# Patient Record
Sex: Female | Born: 1970
Health system: Southern US, Community
[De-identification: ages and names within clinical notes are randomized; demographics above are authoritative.]

## PROBLEM LIST (undated history)

## (undated) DIAGNOSIS — E785 Hyperlipidemia, unspecified: Secondary | ICD-10-CM

## (undated) HISTORY — DX: Hyperlipidemia, unspecified: E78.5

---

## 2007-06-25 ENCOUNTER — Emergency Department (HOSPITAL_COMMUNITY): Admission: EM | Admit: 2007-06-25 | Discharge: 2007-06-25 | Payer: Self-pay | Admitting: Family Medicine

## 2008-03-31 ENCOUNTER — Emergency Department (HOSPITAL_COMMUNITY): Admission: EM | Admit: 2008-03-31 | Discharge: 2008-03-31 | Payer: Self-pay | Admitting: Emergency Medicine

## 2016-06-08 DIAGNOSIS — K649 Unspecified hemorrhoids: Secondary | ICD-10-CM | POA: Insufficient documentation

## 2016-07-15 ENCOUNTER — Ambulatory Visit: Payer: Self-pay

## 2016-07-15 ENCOUNTER — Other Ambulatory Visit: Payer: Self-pay | Admitting: Occupational Medicine

## 2016-07-15 DIAGNOSIS — M25512 Pain in left shoulder: Secondary | ICD-10-CM

## 2016-09-04 DIAGNOSIS — J3489 Other specified disorders of nose and nasal sinuses: Secondary | ICD-10-CM | POA: Diagnosis not present

## 2016-09-04 DIAGNOSIS — R509 Fever, unspecified: Secondary | ICD-10-CM | POA: Diagnosis not present

## 2016-09-04 DIAGNOSIS — R52 Pain, unspecified: Secondary | ICD-10-CM | POA: Diagnosis not present

## 2016-09-04 DIAGNOSIS — R6889 Other general symptoms and signs: Secondary | ICD-10-CM | POA: Diagnosis not present

## 2016-09-04 DIAGNOSIS — J101 Influenza due to other identified influenza virus with other respiratory manifestations: Secondary | ICD-10-CM | POA: Diagnosis not present

## 2016-11-10 DIAGNOSIS — H5213 Myopia, bilateral: Secondary | ICD-10-CM | POA: Diagnosis not present

## 2016-12-25 ENCOUNTER — Ambulatory Visit (HOSPITAL_COMMUNITY)
Admission: EM | Admit: 2016-12-25 | Discharge: 2016-12-25 | Disposition: A | Discharge status: Home / Self Care | Payer: 59 | Attending: Emergency Medicine | Admitting: Emergency Medicine

## 2016-12-25 ENCOUNTER — Encounter (HOSPITAL_COMMUNITY): Payer: Self-pay | Admitting: Family Medicine

## 2016-12-25 DIAGNOSIS — J111 Influenza due to unidentified influenza virus with other respiratory manifestations: Secondary | ICD-10-CM

## 2016-12-25 DIAGNOSIS — J0100 Acute maxillary sinusitis, unspecified: Secondary | ICD-10-CM | POA: Diagnosis not present

## 2016-12-25 DIAGNOSIS — R69 Illness, unspecified: Secondary | ICD-10-CM

## 2016-12-25 MED ORDER — PREDNISONE 20 MG PO TABS: 40.0000 mg | ORAL_TABLET | Freq: Every day | ORAL | 0 refills | Status: AC

## 2016-12-25 MED ORDER — AMOXICILLIN-POT CLAVULANATE 875-125 MG PO TABS: 1.0000 | ORAL_TABLET | Freq: Two times a day (BID) | ORAL | 0 refills | Status: DC

## 2016-12-25 MED ORDER — OSELTAMIVIR PHOSPHATE 75 MG PO CAPS: 75.0000 mg | ORAL_CAPSULE | Freq: Two times a day (BID) | ORAL | 0 refills | Status: DC

## 2016-12-25 MED ORDER — IBUPROFEN 800 MG PO TABS: 800.0000 mg | ORAL_TABLET | Freq: Three times a day (TID) | ORAL | 0 refills | Status: DC

## 2016-12-25 MED ORDER — FLUTICASONE PROPIONATE 50 MCG/ACT NA SUSP: 2.0000 | Freq: Every day | NASAL | 0 refills | Status: DC

## 2016-12-25 MED ORDER — HYDROCOD POLST-CPM POLST ER 10-8 MG/5ML PO SUER: 5.0000 mL | Freq: Two times a day (BID) | ORAL | 0 refills | Status: DC | PRN

## 2016-12-25 MED FILL — OSELTAMIVIR PHOS 75 MG CAP: 75 | 5 days supply | Qty: 10 | Fill #0

## 2016-12-25 MED FILL — HYDROCODONE-CHLORPHENIRAM S: 10-8 | 12 days supply | Qty: 120 | Fill #0

## 2016-12-25 MED FILL — predniSONE 20 MG TABS: 20 | 5 days supply | Qty: 10 | Fill #0

## 2016-12-25 NOTE — Discharge Instructions (Signed)
Take the medication as written. Return if you get worse, have a fever >100.4, or for any concerns. You may take 800 mg of motrin with 1 gram of tylenol up to 3 times a day as needed for pain. This is an effective combination for pain.  Most sinus infections are viral and do not need antibiotics unless you have a high fever, have had this for 10 days, or you get better and then get sick again. Use a neti pot or the NeilMed sinus rinse as often as you want to to reduce nasal congestion. Follow the directions on the box.   Tamiflu, Flonase, saline nasal irrigation, Mucinex D 1200/120, ibuprofen with 1 g of Tylenol 3 times a day, Tussionex. wait-and-see prescription of Augmentin and prednisone if this develops into a bacterial sinus infection, if you're not getting better despite the Tamiflu, Mucinex, saline nasal irrigation and the Flonase, for facial swelling.  Go to www.goodrx.com to look up your medications. This will give you a list of where you can find your prescriptions at the most affordable prices.

## 2016-12-25 NOTE — ED Provider Notes (Signed)
HPI  SUBJECTIVE:  Claudia Duran is a 46 y.o. female who presents with influenza-like symptoms with greenish yellowish nasal congestion, rhinorrhea, postnasal drip, body aches, headaches, cough productive of the same material as her postnasal drip, wheezing, fatigue, sinus pain and pressure. This started 2 days ago. She is unsure if she has had any fevers, but she's been alternating Tylenol 1 g and ibuprofen 800 mg which helped her symptoms. She has also tried Zicam and DayQuil. Symptoms worse with bending forward and lying down. She states that her upper teeth hurt. Denies facial swelling. She denies shortness of breath, chest pain. She states that she cannot sleep at night secondary to the cough. 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 She did get a flu shot this year. She denies chest pain, neck stiffness, rash, photophobia, abdominal pain, urinary complaints. She states that she has gotten the flu "every year" and is usually accompanied with a sinusitis with facial swelling. She states that she is usually prescribed Tamiflu, steroids and antibiotics. She has no past medical history of asthma, emphysema, COPD, diabetes, hypertension. She is not a smoker. LMP: 12/25. She denies the possibility of being pregnant. PMD: [facility]    History reviewed. No pertinent past medical history.  History reviewed. No pertinent surgical history.  History reviewed. No pertinent family history.  Social History  Substance Use Topics  . Smoking status: Never Smoker  . Smokeless tobacco: Never Used  . Alcohol use Not on file    No current facility-administered medications for this encounter.   Current Outpatient Prescriptions:  .  amoxicillin-clavulanate (AUGMENTIN) 875-125 MG tablet, Take 1 tablet by mouth 2 (two) times daily. X 7 days, Disp: 14 tablet, Rfl: 0 .  chlorpheniramine-HYDROcodone (TUSSIONEX PENNKINETIC ER) 10-8 MG/5ML SUER, Take 5 mLs by mouth every 12 (twelve) hours  as needed for cough., Disp: 120 mL, Rfl: 0 .  fluticasone (FLONASE) 50 MCG/ACT nasal spray, Place 2 sprays into both nostrils daily., Disp: 16 g, Rfl: 0 .  ibuprofen (ADVIL,MOTRIN) 800 MG tablet, Take 1 tablet (800 mg total) by mouth 3 (three) times daily., Disp: 30 tablet, Rfl: 0 .  oseltamivir (TAMIFLU) 75 MG capsule, Take 1 capsule (75 mg total) by mouth 2 (two) times daily. X 5 days, Disp: 10 capsule, Rfl: 0 .  predniSONE (DELTASONE) 20 MG tablet, Take 2 tablets (40 mg total) by mouth daily with breakfast., Disp: 10 tablet, Rfl: 0  Allergies  Allergen Reactions  . Aspirin      ROS  As noted in HPI.   Physical Exam  BP 135/98   Pulse 98   Temp 98.3 F (36.8 C)   Resp 18   LMP 12/14/2016   SpO2 98%   Constitutional: Well developed, well nourished, no acute distress Eyes:  EOMI, conjunctiva normal bilaterally HENT: Normocephalic, atraumatic, mucus membranes moist. TM's as normal bilaterally. Positive clear rhinorrhea. Negative frontal sinus tenderness. Positive maxillary sinus tenderness. Normal oropharynx. Positive postnasal drip. Neck: Positive shotty cervical lymphadenopathy. No meningismus Respiratory: Normal inspiratory effort, lungs clear bilaterally good air movement Cardiovascular: Normal rate regular rhythm, no murmurs rubs or gallops. GI: nondistended skin: No rash, skin intact Musculoskeletal: no deformities Neurologic: Alert & oriented x 3, no focal neuro deficits Psychiatric: Speech and behavior appropriate   ED Course   Medications - No data to display  No orders of the defined types were placed in this encounter.   No results found for this or any previous visit (from  the past 24 hour(s)). No results found.  ED Clinical Impression  Influenza-like illness  Acute maxillary sinusitis, recurrence not specified   ED Assessment/Plan  Vitals normal, but patient did take antipyretics within 6-8 hours of evaluation. Presentation most consistent with  influenza-like illness and viral sinusitis. will prescribe Tamiflu, Flonase, saline nasal irrigation, Mucinex D 1200/120, ibuprofen with 1 g of Tylenol 3 times a day, Tussionex. We'll send home with a wait-and-see prescription of Augmentin and prednisone if this develops into a bacterial sinus infection.  discussed medical decision-making, plan for follow up, signs and symptoms that should prompt return to the ED. Patient agrees with plan.   Meds ordered this encounter  Medications  . chlorpheniramine-HYDROcodone (TUSSIONEX PENNKINETIC ER) 10-8 MG/5ML SUER    Sig: Take 5 mLs by mouth every 12 (twelve) hours as needed for cough.    Dispense:  120 mL    Refill:  0  . fluticasone (FLONASE) 50 MCG/ACT nasal spray    Sig: Place 2 sprays into both nostrils daily.    Dispense:  16 g    Refill:  0  . oseltamivir (TAMIFLU) 75 MG capsule    Sig: Take 1 capsule (75 mg total) by mouth 2 (two) times daily. X 5 days    Dispense:  10 capsule    Refill:  0  . ibuprofen (ADVIL,MOTRIN) 800 MG tablet    Sig: Take 1 tablet (800 mg total) by mouth 3 (three) times daily.    Dispense:  30 tablet    Refill:  0  . amoxicillin-clavulanate (AUGMENTIN) 875-125 MG tablet    Sig: Take 1 tablet by mouth 2 (two) times daily. X 7 days    Dispense:  14 tablet    Refill:  0  . predniSONE (DELTASONE) 20 MG tablet    Sig: Take 2 tablets (40 mg total) by mouth daily with breakfast.    Dispense:  10 tablet    Refill:  0    *This clinic note was created using Scientist, clinical (histocompatibility and immunogenetics). Therefore, there may be occasional mistakes despite careful proofreading.  ?   Domenick Gong, MD 12/25/16 [phone]

## 2016-12-25 NOTE — ED Triage Notes (Signed)
Pt here for 2 day of URI symptoms.

## 2017-01-05 MED FILL — ANUCORT-HC 25 MG SUPP: 25 | 15 days supply | Qty: 30 | Fill #0

## 2017-06-01 MED FILL — AMOX-CLAV 875-125 MG TABLET: 875-125 | 7 days supply | Qty: 14 | Fill #0

## 2017-06-01 MED FILL — FLUTICASONE PROP 50 MCG SPR: 50 | 30 days supply | Qty: 16 | Fill #0

## 2017-06-14 MED FILL — ANUCORT-HC 25 MG SUPP: 25 | 15 days supply | Qty: 30 | Fill #0

## 2017-10-07 DIAGNOSIS — J069 Acute upper respiratory infection, unspecified: Secondary | ICD-10-CM | POA: Diagnosis not present

## 2017-10-07 DIAGNOSIS — J101 Influenza due to other identified influenza virus with other respiratory manifestations: Secondary | ICD-10-CM | POA: Diagnosis not present

## 2017-10-07 MED FILL — HYDROCODONE-CHLORPHEN ER SU: 10-8 | 18 days supply | Qty: 90 | Fill #0

## 2017-10-07 MED FILL — AMANTADINE HCL 100 MG TAB: 100 | 5 days supply | Qty: 10 | Fill #0

## 2018-01-10 MED FILL — HEMMOREX-HC 25 MG SUPP: 25 | 15 days supply | Qty: 30 | Fill #0

## 2018-01-17 DIAGNOSIS — B349 Viral infection, unspecified: Secondary | ICD-10-CM | POA: Diagnosis not present

## 2018-01-17 MED FILL — AMANTADINE HCL 100 MG TAB: 100 | 5 days supply | Qty: 10 | Fill #0

## 2018-01-17 MED FILL — HYDROCODONE-CHLORPHEN ER SU: 10-8 | 18 days supply | Qty: 90 | Fill #0

## 2018-01-30 IMAGING — CR DG SHOULDER 2+V*L*
4 series · 4 of 4 positions shown · non-contrast
Comparison: None.

CLINICAL DATA: Injured the left shoulder 1 week ago with persistent
pain

EXAM:
LEFT SHOULDER - 2+ VIEW

[view not recorded (1 of 4)]
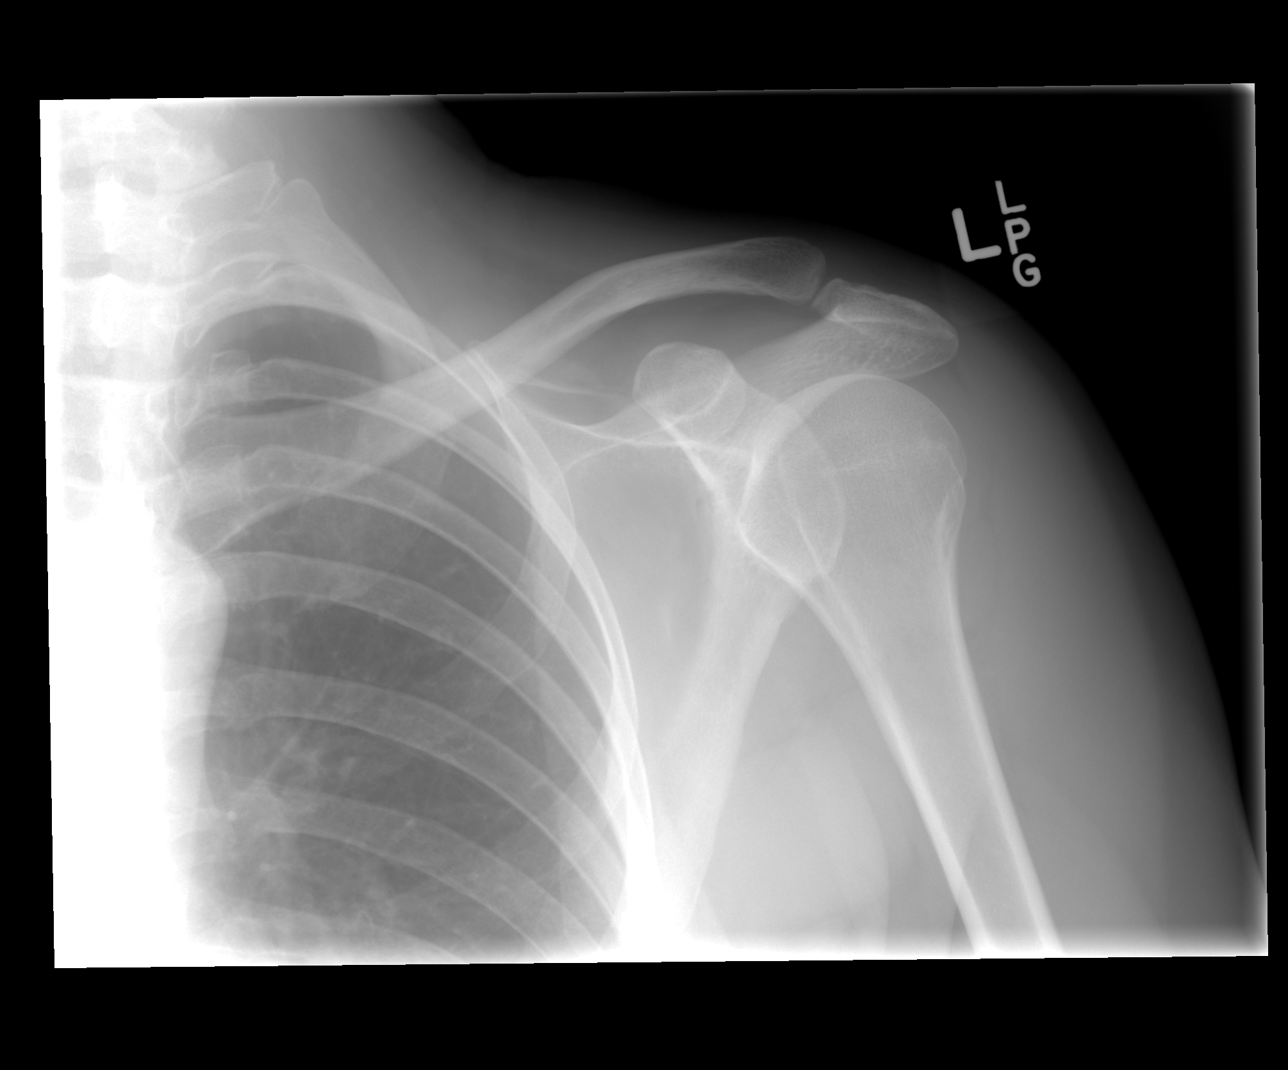

[view not recorded (2 of 4)]
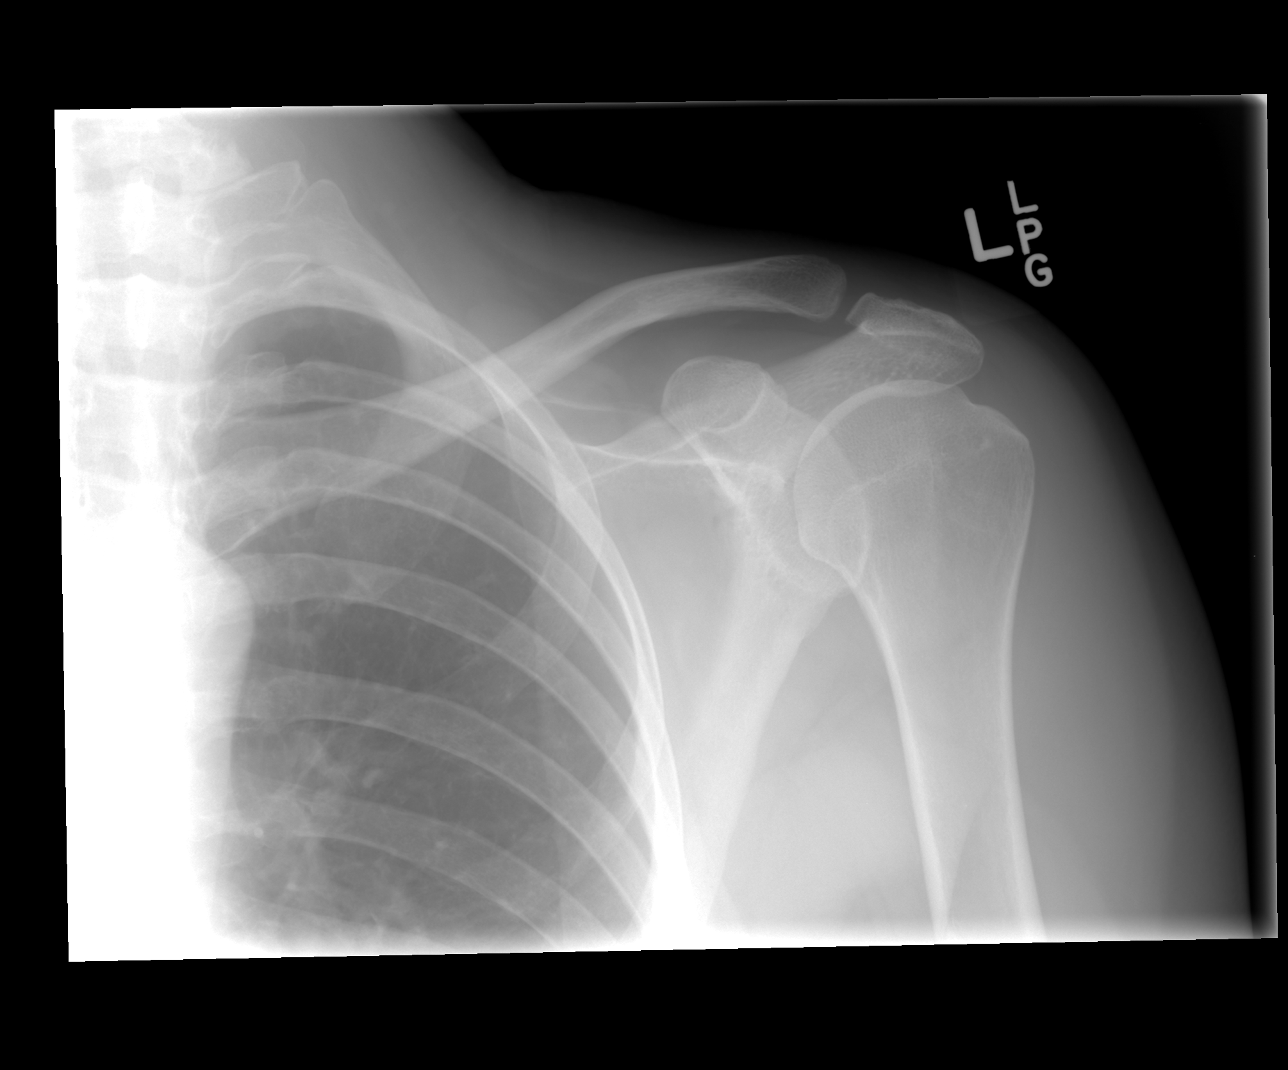

[view not recorded (3 of 4)]
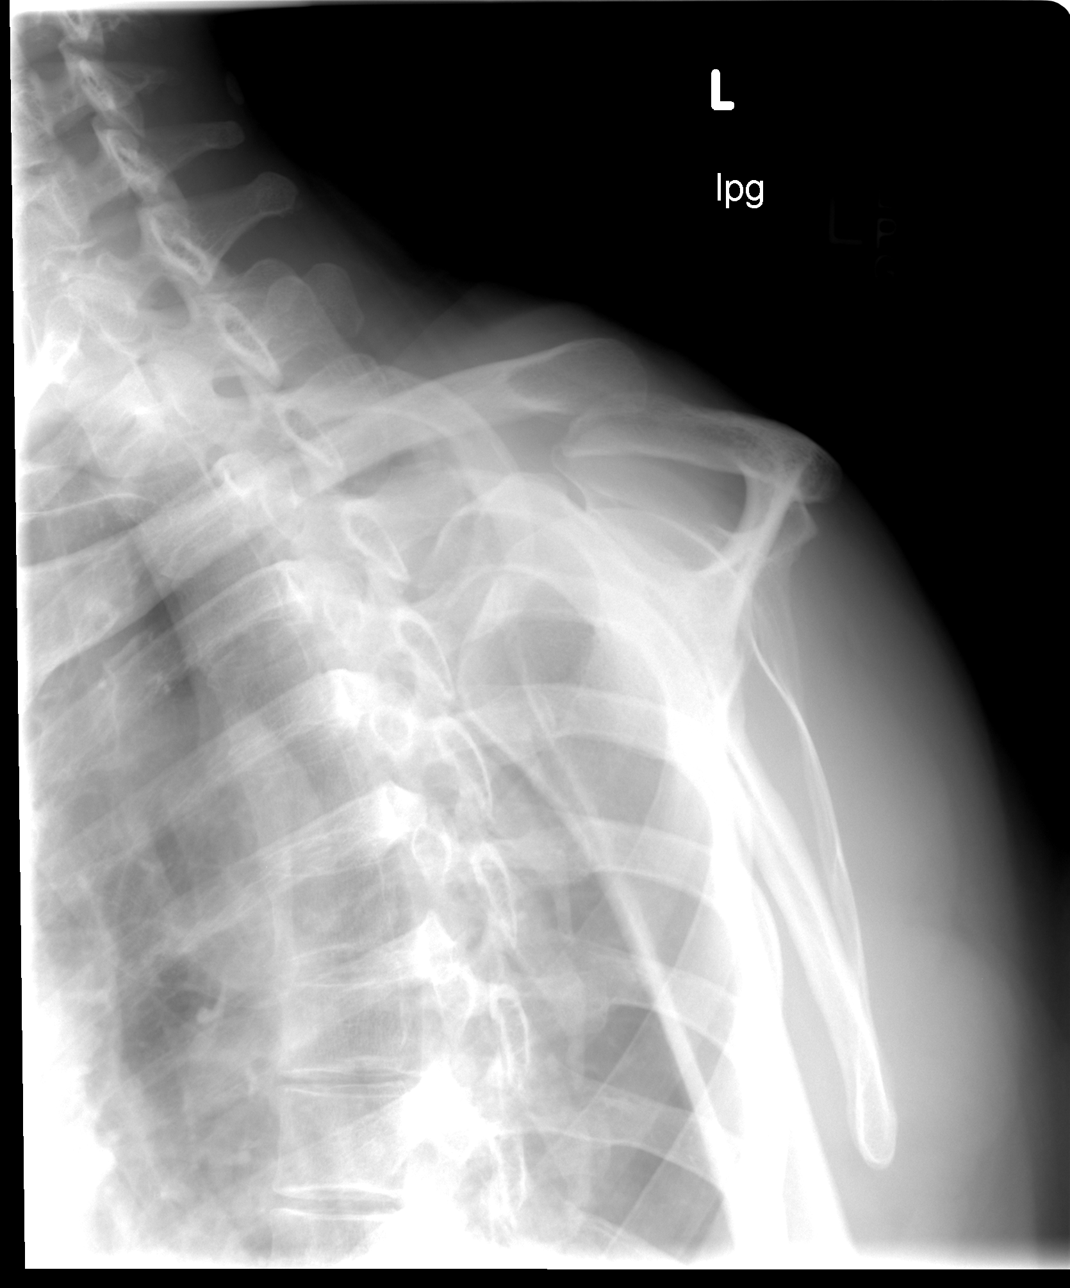

[view not recorded (4 of 4)]
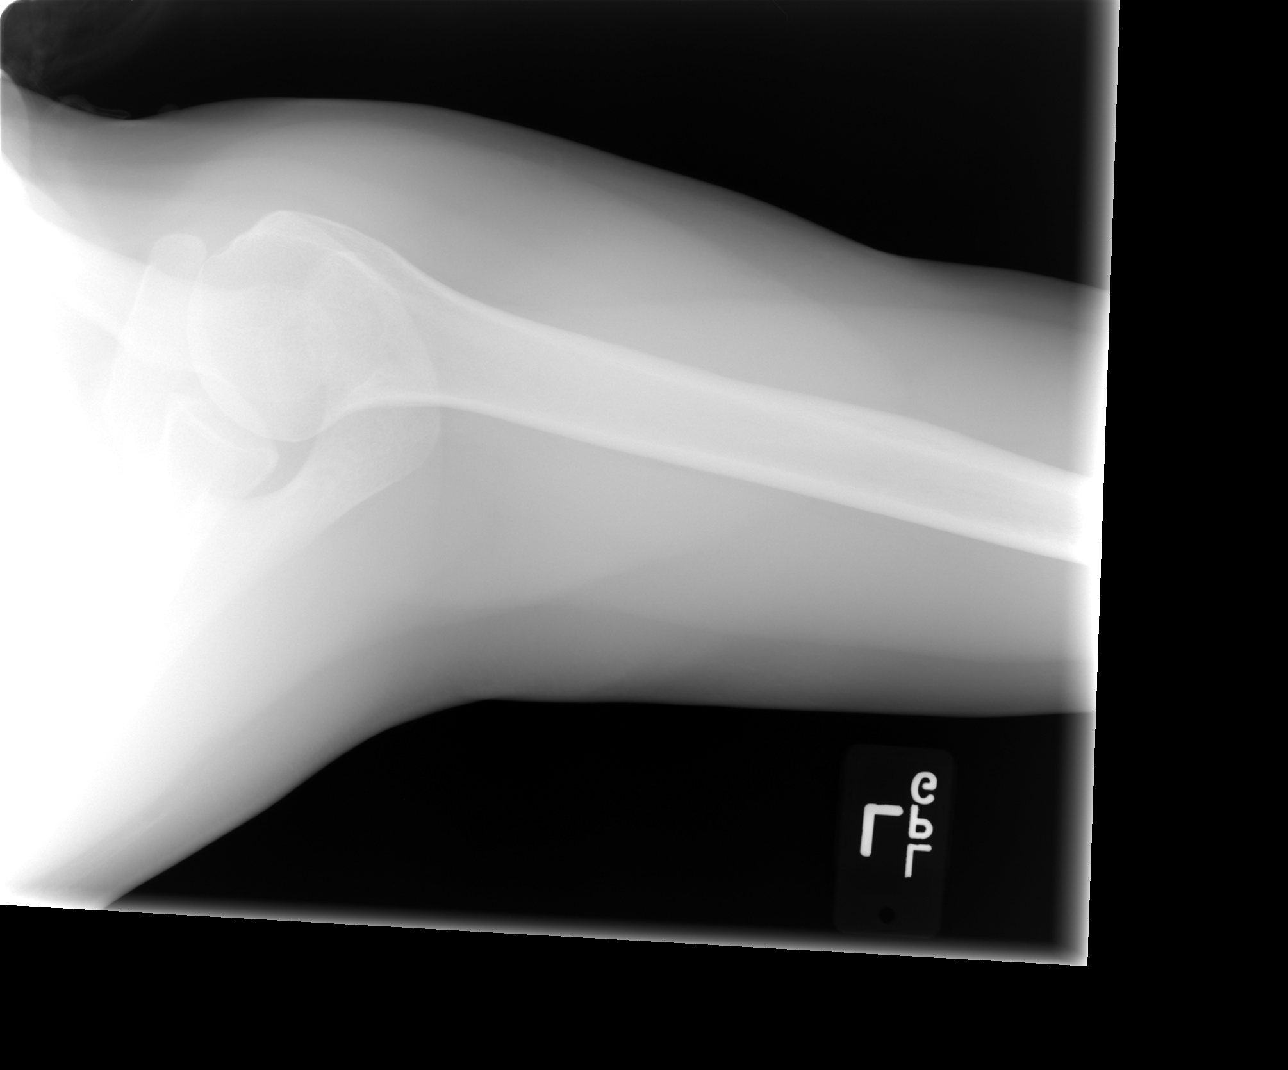

[4 of 4 positions shown; findings below may reference images not displayed]

FINDINGS: The left humeral head is in normal position and the left
glenohumeral joint space appears normal. Left AC joint is normally
aligned. No acute abnormality is seen.
IMPRESSION: Negative.

## 2018-05-13 MED FILL — HYDROCORTISONE ACETATE 25 M: 25 | 15 days supply | Qty: 30 | Fill #0

## 2018-06-20 DIAGNOSIS — Z0000 Encounter for general adult medical examination without abnormal findings: Secondary | ICD-10-CM | POA: Diagnosis not present

## 2018-06-20 DIAGNOSIS — Z13220 Encounter for screening for lipoid disorders: Secondary | ICD-10-CM | POA: Diagnosis not present

## 2018-06-20 DIAGNOSIS — Z130 Encounter for screening for diseases of the blood and blood-forming organs and certain disorders involving the immune mechanism: Secondary | ICD-10-CM | POA: Diagnosis not present

## 2018-06-20 DIAGNOSIS — N6320 Unspecified lump in the left breast, unspecified quadrant: Secondary | ICD-10-CM | POA: Diagnosis not present

## 2018-06-20 DIAGNOSIS — N941 Unspecified dyspareunia: Secondary | ICD-10-CM | POA: Diagnosis not present

## 2018-06-20 DIAGNOSIS — Z309 Encounter for contraceptive management, unspecified: Secondary | ICD-10-CM | POA: Diagnosis not present

## 2018-06-20 DIAGNOSIS — N6341 Unspecified lump in right breast, subareolar: Secondary | ICD-10-CM | POA: Diagnosis not present

## 2018-06-20 DIAGNOSIS — N920 Excessive and frequent menstruation with regular cycle: Secondary | ICD-10-CM | POA: Diagnosis not present

## 2018-06-20 MED FILL — NUVARING VAGINAL RING: 0.12-0.015 | 28 days supply | Qty: 1 | Fill #0

## 2018-06-24 DIAGNOSIS — N6002 Solitary cyst of left breast: Secondary | ICD-10-CM | POA: Diagnosis not present

## 2018-06-24 DIAGNOSIS — N6001 Solitary cyst of right breast: Secondary | ICD-10-CM | POA: Diagnosis not present

## 2018-06-24 DIAGNOSIS — R922 Inconclusive mammogram: Secondary | ICD-10-CM | POA: Diagnosis not present

## 2018-06-30 ENCOUNTER — Ambulatory Visit: Payer: Self-pay | Admitting: Obstetrics & Gynecology

## 2018-07-12 MED FILL — NUVARING VAGINAL RING: 0.12-0.015 | 28 days supply | Qty: 1 | Fill #1

## 2018-08-25 MED FILL — NUVARING VAGINAL RING: 0.12-0.015 | 28 days supply | Qty: 1 | Fill #2

## 2018-09-28 MED FILL — NUVARING VAGINAL RING: 0.12-0.015 | 28 days supply | Qty: 1 | Fill #0

## 2018-10-18 ENCOUNTER — Ambulatory Visit (INDEPENDENT_AMBULATORY_CARE_PROVIDER_SITE_OTHER): Payer: Self-pay | Admitting: Nurse Practitioner

## 2018-10-18 DIAGNOSIS — Z23 Encounter for immunization: Secondary | ICD-10-CM

## 2018-10-18 NOTE — Progress Notes (Signed)
Pt presents here today for visit to receive Influenza(right deltoid) vaccine. Allergies reviewed, vaccine given, vaccine information statement provided, tolerated well.     

## 2018-11-18 MED FILL — NUVARING VAGINAL RING: 0.12-0.015 | 28 days supply | Qty: 1 | Fill #1

## 2018-12-01 ENCOUNTER — Ambulatory Visit (INDEPENDENT_AMBULATORY_CARE_PROVIDER_SITE_OTHER): Payer: Self-pay | Admitting: Family Medicine

## 2018-12-01 VITALS — BP 125/90 | HR 115 | Temp 98.8°F | Resp 18 | Wt 153.2 lb

## 2018-12-01 DIAGNOSIS — R6889 Other general symptoms and signs: Secondary | ICD-10-CM

## 2018-12-01 DIAGNOSIS — J101 Influenza due to other identified influenza virus with other respiratory manifestations: Secondary | ICD-10-CM

## 2018-12-01 DIAGNOSIS — R05 Cough: Secondary | ICD-10-CM

## 2018-12-01 DIAGNOSIS — R059 Cough, unspecified: Secondary | ICD-10-CM

## 2018-12-01 LAB — POCT INFLUENZA A/B
INFLUENZA A, POC: POSITIVE — AB
INFLUENZA B, POC: NEGATIVE

## 2018-12-01 MED ORDER — OSELTAMIVIR PHOSPHATE 75 MG PO CAPS: 75.0000 mg | ORAL_CAPSULE | Freq: Two times a day (BID) | ORAL | 0 refills | Status: DC

## 2018-12-01 MED ORDER — AMANTADINE HCL 100 MG PO CAPS: 100.0000 mg | ORAL_CAPSULE | Freq: Two times a day (BID) | ORAL | 0 refills | Status: DC

## 2018-12-01 MED ORDER — PROMETHAZINE-DM 6.25-15 MG/5ML PO SYRP: 5.0000 mL | ORAL_SOLUTION | Freq: Three times a day (TID) | ORAL | 0 refills | Status: DC | PRN

## 2018-12-01 MED ORDER — BENZONATATE 200 MG PO CAPS: 200.0000 mg | ORAL_CAPSULE | Freq: Three times a day (TID) | ORAL | 0 refills | Status: DC | PRN

## 2018-12-01 MED FILL — AMANTADINE HCL 100 MG TAB: 100 | 5 days supply | Qty: 10 | Fill #0

## 2018-12-01 MED FILL — BENZONATATE 200 MG CAPS: 200 | 7 days supply | Qty: 20 | Fill #0

## 2018-12-01 MED FILL — PROMETHAZINE W/DM SYRUP: 6.25-15 | 8 days supply | Qty: 118 | Fill #0

## 2018-12-01 NOTE — Progress Notes (Signed)
+  FLU A

## 2018-12-01 NOTE — Patient Instructions (Signed)

## 2018-12-01 NOTE — Progress Notes (Signed)
Claudia Duran is a 47 y.o. female who presents today with concerns of cough, congestion and body aches for the last 48 hours. 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 She has used over the counter mucinex for her symptoms and reports that she has yellow/green mucus. She reports that she has had the flu every year for the past few years- chart review is consistent with patient report.  Review of Systems  Constitutional: Positive for fever and malaise/fatigue. Negative for chills.  HENT: Positive for congestion and sore throat. Negative for ear discharge, ear pain and sinus pain.   Eyes: Negative.   Respiratory: Positive for cough. Negative for sputum production and shortness of breath.   Cardiovascular: Negative.  Negative for chest pain.  Gastrointestinal: Negative for abdominal pain, diarrhea, nausea and vomiting.  Genitourinary: Negative for dysuria, frequency, hematuria and urgency.  Musculoskeletal: Negative for myalgias.  Skin: Negative.   Neurological: Positive for headaches. Negative for dizziness.  Endo/Heme/Allergies: Negative.   Psychiatric/Behavioral: Negative.     O: Vitals:   12/01/18 1227  BP: 125/90  Pulse: (!) 115  Resp: 18  Temp: 98.8 F (37.1 C)  SpO2: 97%     Physical Exam Vitals signs reviewed.  Constitutional:      Appearance: She is well-developed. She is ill-appearing. She is not toxic-appearing or diaphoretic.  HENT:     Head: Normocephalic.     Right Ear: Hearing, tympanic membrane, ear canal and external ear normal.     Left Ear: Hearing, tympanic membrane, ear canal and external ear normal.     Nose: Nose normal.     Mouth/Throat:     Mouth: Mucous membranes are moist.     Pharynx: Uvula midline.  Neck:     Musculoskeletal: Normal range of motion and neck supple.  Cardiovascular:     Rate and Rhythm: Regular rhythm. Tachycardia present.     Pulses: Normal pulses.     Heart sounds: Normal heart  sounds.  Pulmonary:     Effort: Pulmonary effort is normal.     Breath sounds: Normal breath sounds. No decreased breath sounds, wheezing, rhonchi or rales.     Comments: Frequent dry non productive cough on exam Abdominal:     General: Bowel sounds are normal.     Palpations: Abdomen is soft.  Musculoskeletal: Normal range of motion.  Lymphadenopathy:     Head:     Right side of head: No submental or submandibular adenopathy.     Left side of head: No submental or submandibular adenopathy.     Cervical: No cervical adenopathy.  Neurological:     Mental Status: She is alert and oriented to person, place, and time.  Psychiatric:        Behavior: Behavior is cooperative.    A: 1. Flu-like symptoms   2. Influenza A   3. Cough    P: Discussed exam findings, diagnosis etiology and medication use and indications reviewed with patient. Follow- Up and discharge instructions provided. No emergent/urgent issues found on exam.  Patient verbalized understanding of information provided and agrees with plan of care (POC), all questions answered.  1. Influenza A - amantadine (SYMMETREL) 100 MG capsule; Take 1 capsule (100 mg total) by mouth 2 (two) times daily for 5 days.  Patient previously used this medication successfully for Influenza A- reports intolerance to Tamiflu of nausea and vomiting after use. Work note for 72 hours provided discussed in  clinic follow up precautions.   2. Flu-like symptoms - POCT Influenza A/B Results for orders placed or performed in visit on 12/01/18 (from the past 24 hour(s))  POCT Influenza A/B     Status: Abnormal   Collection Time: 12/01/18 12:40 PM  Result Value Ref Range   Influenza A, POC Positive (A) Negative   Influenza B, POC Negative Negative    3. Cough - promethazine-dextromethorphan (PROMETHAZINE-DM) 6.25-15 MG/5ML syrup; Take 5 mLs by mouth 3 (three) times daily as needed for cough. - benzonatate (TESSALON) 200 MG capsule; Take 1 capsule  (200 mg total) by mouth 3 (three) times daily as needed.

## 2018-12-07 MED FILL — HYDROCORTISONE ACETATE 25 M: 25 | 15 days supply | Qty: 30 | Fill #0

## 2018-12-22 ENCOUNTER — Encounter: Payer: Self-pay | Admitting: Emergency Medicine

## 2018-12-22 ENCOUNTER — Ambulatory Visit
Admission: EM | Admit: 2018-12-22 | Discharge: 2018-12-22 | Disposition: A | Discharge status: Home / Self Care | Payer: 59 | Attending: Physician Assistant | Admitting: Physician Assistant

## 2018-12-22 DIAGNOSIS — J0120 Acute ethmoidal sinusitis, unspecified: Secondary | ICD-10-CM | POA: Diagnosis not present

## 2018-12-22 LAB — POCT INFLUENZA A/B
INFLUENZA A, POC: NEGATIVE
Influenza B, POC: NEGATIVE

## 2018-12-22 MED ORDER — AMOXICILLIN-POT CLAVULANATE 875-125 MG PO TABS: 1.0000 | ORAL_TABLET | Freq: Two times a day (BID) | ORAL | 0 refills | Status: DC

## 2018-12-22 MED FILL — ETONOGESTREL-ETHINYL ESTRAD: 0.12-0.015 | 28 days supply | Qty: 1 | Fill #2

## 2018-12-22 MED FILL — AMOX-CLAV 875-125 MG TABLET: 875-125 | 10 days supply | Qty: 20 | Fill #0

## 2018-12-22 NOTE — ED Notes (Signed)
Patient able to ambulate independently  

## 2018-12-22 NOTE — ED Triage Notes (Signed)
Pt presents to [facility] for assessment of fever, congestion, SOB, headache, generalized dental/gum pain, muffled hearing.

## 2018-12-22 NOTE — Discharge Instructions (Signed)
Return if any problems.

## 2018-12-22 NOTE — ED Provider Notes (Signed)
[name]    CSN: [id number] Arrival date & time: 12/22/18  1428     History   Chief Complaint Chief Complaint  Patient presents with  . Flu-Like Symptoms    HPI Claudia Duran is a 48 y.o. female.   The history is provided by the patient. No language interpreter was used.  Cough  Cough characteristics:  Productive Sputum characteristics:  Unable to specify Severity:  Moderate Onset quality:  Gradual Timing:  Constant Progression:  Worsening Relieved by:  Nothing Worsened by:  Nothing Ineffective treatments:  None tried Associated symptoms: headaches, sinus congestion and sore throat   Associated symptoms: no shortness of breath    Pt complains of sinus drainage and sinus pressure.  Pt had influenza A.  Pt exposed to influenza B this week.   History reviewed. No pertinent past medical history.  There are no active problems to display for this patient.   History reviewed. No pertinent surgical history.  OB History   No obstetric history on file.      Home Medications    Prior to Admission medications   Medication Sig Start Date End Date Taking? Authorizing Provider  amantadine (SYMMETREL) 100 MG capsule Take 1 capsule (100 mg total) by mouth 2 (two) times daily for 5 days. 12/01/18 12/06/18  Zachery Dauer, NP  amoxicillin-clavulanate (AUGMENTIN) 875-125 MG tablet Take 1 tablet by mouth 2 (two) times daily. X 10 days 12/22/18   Elson Areas, PA-C  benzonatate (TESSALON) 200 MG capsule Take 1 capsule (200 mg total) by mouth 3 (three) times daily as needed. 12/01/18   Zachery Dauer, NP  chlorpheniramine-HYDROcodone [facility] PENNKINETIC ER) 10-8 MG/5ML SUER Take 5 mLs by mouth every 12 (twelve) hours as needed for cough. Patient not taking: Reported on 10/18/2018 12/25/16   Domenick Gong, MD  etonogestrel-ethinyl estradiol (NUVARING) 0.12-0.015 MG/24HR vaginal ring INSERT VAGINALLY AND LEAVE IN PLACE FOR 3 CONSECUTIVE WEEKS, THEN REMOVE FOR 1  WEEK. 09/28/18   [provider]  fluticasone (FLONASE) 50 MCG/ACT nasal spray Place 2 sprays into both nostrils daily. Patient not taking: Reported on 10/18/2018 12/25/16   Domenick Gong, MD  ibuprofen (ADVIL,MOTRIN) 800 MG tablet Take 1 tablet (800 mg total) by mouth 3 (three) times daily. Patient not taking: Reported on 10/18/2018 12/25/16   Domenick Gong, MD  promethazine-dextromethorphan (PROMETHAZINE-DM) 6.25-15 MG/5ML syrup Take 5 mLs by mouth 3 (three) times daily as needed for cough. 12/01/18   Zachery Dauer, NP  pseudoephedrine-guaifenesin (MUCINEX D) 60-600 MG 12 hr tablet Take 1 tablet by mouth every 12 (twelve) hours.    [provider]    Family History History reviewed. No pertinent family history.  Social History Social History   Tobacco Use  . Smoking status: Never Smoker  . Smokeless tobacco: Never Used  Substance Use Topics  . Alcohol use: Not on file  . Drug use: Not on file     Allergies   Oseltamivir phosphate and Aspirin   Review of Systems Review of Systems  HENT: Positive for sore throat.   Respiratory: Positive for cough. Negative for shortness of breath.   Neurological: Positive for headaches.  All other systems reviewed and are negative.    Physical Exam Triage Vital Signs ED Triage Vitals [12/22/18 1439]  Enc Vitals Group     BP (!) 151/90     Pulse Rate 100     Resp 18     Temp (!) 97.5 F (36.4 C)     Temp  Source Oral     SpO2 98 %     Weight      Height      Head Circumference      Peak Flow      Pain Score 3     Pain Loc      Pain Edu?      Excl. in GC?    No data found.  Updated Vital Signs BP (!) 151/90 (BP Location: Left Arm)   Pulse 100   Temp (!) 97.5 F (36.4 C) (Oral)   Resp 18   SpO2 98%   Visual Acuity Right Eye Distance:   Left Eye Distance:   Bilateral Distance:    Right Eye Near:   Left Eye Near:    Bilateral Near:     Physical Exam Constitutional:      Appearance: She is  well-developed.  HENT:     Head: Normocephalic and atraumatic.     Right Ear: External ear normal.     Left Ear: External ear normal.     Nose: Nose normal.     Mouth/Throat:     Pharynx: Posterior oropharyngeal erythema present.  Eyes:     Conjunctiva/sclera: Conjunctivae normal.     Pupils: Pupils are equal, round, and reactive to light.  Neck:     Musculoskeletal: Normal range of motion and neck supple.  Cardiovascular:     Rate and Rhythm: Normal rate.  Pulmonary:     Effort: Pulmonary effort is normal.  Abdominal:     Palpations: Abdomen is soft.  Musculoskeletal: Normal range of motion.  Skin:    General: Skin is warm and dry.  Neurological:     Mental Status: She is alert and oriented to person, place, and time.  Psychiatric:        Mood and Affect: Mood normal.      UC Treatments / Results  Labs (all labs ordered are listed, but only abnormal results are displayed) Labs Reviewed  POCT INFLUENZA A/B    EKG None  Radiology No results found.  Procedures Procedures (including critical care time)  Medications Ordered in UC Medications - No data to display  Initial Impression / Assessment and Plan / UC Course  I have reviewed the triage vital signs and the nursing notes.  Pertinent labs & imaging results that were available during my care of the patient were reviewed by me and considered in my medical decision making (see chart for details).      Final Clinical Impressions(s) / UC Diagnoses   Final diagnoses:  Acute ethmoidal sinusitis, recurrence not specified     Discharge Instructions     Return if any problems.    ED Prescriptions    Medication Sig Dispense Auth. Provider   amoxicillin-clavulanate (AUGMENTIN) 875-125 MG tablet  (Status: Discontinued) Take 1 tablet by mouth 2 (two) times daily. X 10 days 20 tablet Basil Blakesley K, PA-C   amoxicillin-clavulanate (AUGMENTIN) 875-125 MG tablet Take 1 tablet by mouth 2 (two) times daily. X 10  days 20 tablet Elson AreasSofia, Claudia Duran K, [place]PA-C     Controlled Substance Prescriptions [place] Controlled Substance Registry consulted? Not Applicable  An After Visit Summary was printed and given to the patient.    Elson AreasSofia, Cache Bills K, [place]-C 12/22/18 1850

## 2018-12-31 ENCOUNTER — Telehealth: Payer: Self-pay | Admitting: Family Medicine

## 2018-12-31 MED ORDER — FLUCONAZOLE 150 MG PO TABS: 150.0000 mg | ORAL_TABLET | Freq: Every day | ORAL | 0 refills | Status: DC

## 2018-12-31 NOTE — Telephone Encounter (Signed)
Having vaginitis symptoms after taking antibiotics

## 2019-06-27 MED FILL — HYDROCORTISONE ACETATE 25 M: 25 | 7 days supply | Qty: 14 | Fill #0

## 2019-09-07 DIAGNOSIS — G4700 Insomnia, unspecified: Secondary | ICD-10-CM | POA: Diagnosis not present

## 2019-09-07 DIAGNOSIS — Z0000 Encounter for general adult medical examination without abnormal findings: Secondary | ICD-10-CM | POA: Diagnosis not present

## 2019-09-07 DIAGNOSIS — Z23 Encounter for immunization: Secondary | ICD-10-CM | POA: Diagnosis not present

## 2019-09-07 DIAGNOSIS — Z309 Encounter for contraceptive management, unspecified: Secondary | ICD-10-CM | POA: Diagnosis not present

## 2019-09-07 MED FILL — ETONOGESTREL-ETHINYL ESTRAD: 0.12-0.015 | 84 days supply | Qty: 3 | Fill #0

## 2019-09-07 MED FILL — traZODone HCL 50 MG TABS: 50 | 30 days supply | Qty: 30 | Fill #0

## 2019-10-25 ENCOUNTER — Other Ambulatory Visit (HOSPITAL_COMMUNITY): Payer: Self-pay | Admitting: Physician Assistant

## 2019-10-25 ENCOUNTER — Ambulatory Visit (HOSPITAL_COMMUNITY): Admission: RE | Admit: 2019-10-25 | Payer: 59 | Source: Ambulatory Visit

## 2019-10-25 DIAGNOSIS — M79661 Pain in right lower leg: Secondary | ICD-10-CM | POA: Diagnosis not present

## 2019-10-25 DIAGNOSIS — M79604 Pain in right leg: Secondary | ICD-10-CM

## 2019-10-25 DIAGNOSIS — M7989 Other specified soft tissue disorders: Secondary | ICD-10-CM | POA: Diagnosis not present

## 2019-11-15 ENCOUNTER — Ambulatory Visit
Admission: EM | Admit: 2019-11-15 | Discharge: 2019-11-15 | Disposition: A | Discharge status: Home / Self Care | Payer: 59 | Attending: Emergency Medicine | Admitting: Emergency Medicine

## 2019-11-15 ENCOUNTER — Encounter: Payer: Self-pay | Admitting: Emergency Medicine

## 2019-11-15 ENCOUNTER — Other Ambulatory Visit: Payer: Self-pay

## 2019-11-15 DIAGNOSIS — Z20828 Contact with and (suspected) exposure to other viral communicable diseases: Secondary | ICD-10-CM

## 2019-11-15 DIAGNOSIS — B9789 Other viral agents as the cause of diseases classified elsewhere: Secondary | ICD-10-CM

## 2019-11-15 DIAGNOSIS — R110 Nausea: Secondary | ICD-10-CM

## 2019-11-15 DIAGNOSIS — J0101 Acute recurrent maxillary sinusitis: Secondary | ICD-10-CM

## 2019-11-15 DIAGNOSIS — R0981 Nasal congestion: Secondary | ICD-10-CM | POA: Diagnosis not present

## 2019-11-15 LAB — POCT INFLUENZA A/B
Influenza A, POC: NEGATIVE
Influenza B, POC: NEGATIVE

## 2019-11-15 MED ORDER — AMOXICILLIN-POT CLAVULANATE 875-125 MG PO TABS: 1.0000 | ORAL_TABLET | Freq: Two times a day (BID) | ORAL | 0 refills | Status: DC

## 2019-11-15 MED ORDER — ONDANSETRON 4 MG PO TBDP: 4.0000 mg | ORAL_TABLET | Freq: Three times a day (TID) | ORAL | 0 refills | Status: DC | PRN

## 2019-11-15 MED ORDER — FLUCONAZOLE 150 MG PO TABS: 150.0000 mg | ORAL_TABLET | Freq: Every day | ORAL | 0 refills | Status: DC

## 2019-11-15 MED FILL — AMOX-CLAV 875-125 MG TABLET: 875-125 | 10 days supply | Qty: 20 | Fill #0

## 2019-11-15 MED FILL — ONDANSETRON ODT 4 MG TABLET: 4 | 7 days supply | Qty: 21 | Fill #0

## 2019-11-15 MED FILL — FLUCONAZOLE 150 MG TABLET: 150 | 2 days supply | Qty: 2 | Fill #0

## 2019-11-15 NOTE — ED Triage Notes (Signed)
Pt presents to [facility] for 2-3 days of nasal congestion, nasal pressure, post-nasal drip, body aches, and mild sore throat.  Having to clear her throat, but denies obvious cough.

## 2019-11-15 NOTE — ED Notes (Signed)
Patient able to ambulate independently  

## 2019-11-15 NOTE — Discharge Instructions (Signed)
Your COVID test is pending - it is important to quarantine / isolate at home until your results are back. °If you test positive and would like further evaluation for persistent or worsening symptoms, you may schedule an E-visit or virtual (video) visit throughout the [facility] MyChart app or website. ° °PLEASE NOTE: If you develop severe chest pain or shortness of breath please go to the ER or call [phone] for further evaluation --> DO NOT schedule electronic or virtual visits for this. °Please call our office for further guidance / recommendations as needed. °

## 2019-11-15 NOTE — ED Provider Notes (Addendum)
[name]    CSN: [id number] Arrival date & time: 11/15/19  1047      History   Chief Complaint Chief Complaint  Patient presents with  . sinus congestion    HPI Claudia Duran is a 48 y.o. female with history of a chronic bacterial sinusitis presenting for 3-day course of nasal congestion, pressure, postnasal drip, scratchy throat, myalgias, malaise.  Patient has not tried anything for this, states she gets sinus infections annually.  Patient largely concerned regarding sinus pressure that is now keeping her up at night, hurting her teeth.  Patient does work as an Nutritional therapistR nurse, provides care for Covid positive patients.  History reviewed. No pertinent past medical history.  There are no active problems to display for this patient.   History reviewed. No pertinent surgical history.  OB History   No obstetric history on file.      Home Medications    Prior to Admission medications   Medication Sig Start Date End Date Taking? Authorizing Provider  amantadine (SYMMETREL) 100 MG capsule Take 1 capsule (100 mg total) by mouth 2 (two) times daily for 5 days. 12/01/18 12/06/18  Zachery DauerGraham, Elysa, NP  amoxicillin-clavulanate (AUGMENTIN) 875-125 MG tablet Take 1 tablet by mouth 2 (two) times daily. X 10 days 11/15/19   Hall-Potvin, [place], PA-C  benzonatate (TESSALON) 200 MG capsule Take 1 capsule (200 mg total) by mouth 3 (three) times daily as needed. 12/01/18   Zachery DauerGraham, Elysa, NP  etonogestrel-ethinyl estradiol (NUVARING) 0.12-0.015 MG/24HR vaginal ring INSERT VAGINALLY AND LEAVE IN PLACE FOR 3 CONSECUTIVE WEEKS, THEN REMOVE FOR 1 WEEK. 09/28/18   [provider]  fluconazole (DIFLUCAN) 150 MG tablet Take 1 tablet (150 mg total) by mouth daily. 11/15/19   Hall-Potvin, [place], PA-C  fluticasone (FLONASE) 50 MCG/ACT nasal spray Place 2 sprays into both nostrils daily. Patient not taking: Reported on 10/18/2018 12/25/16   Domenick GongMortenson, Ashley, MD  ibuprofen  (ADVIL,MOTRIN) 800 MG tablet Take 1 tablet (800 mg total) by mouth 3 (three) times daily. Patient not taking: Reported on 10/18/2018 12/25/16   Domenick GongMortenson, Ashley, MD  ondansetron (ZOFRAN ODT) 4 MG disintegrating tablet Take 1 tablet (4 mg total) by mouth every 8 (eight) hours as needed for nausea or vomiting. 11/15/19   Hall-Potvin, [place], PA-C    Family History Family History  Problem Relation Age of Onset  . Healthy Mother   . Healthy Father     Social History Social History   Tobacco Use  . Smoking status: Never Smoker  . Smokeless tobacco: Never Used  Substance Use Topics  . Alcohol use: Not Currently  . Drug use: Never     Allergies   Oseltamivir phosphate and Aspirin   Review of Systems Review of Systems  Constitutional: Negative for activity change, appetite change, fatigue and fever.  HENT: Positive for congestion, sinus pressure, sinus pain and sore throat. Negative for dental problem, ear pain, facial swelling, hearing loss, trouble swallowing and voice change.   Eyes: Negative for photophobia, pain and visual disturbance.  Respiratory: Negative for cough and shortness of breath.   Cardiovascular: Negative for chest pain and palpitations.  Gastrointestinal: Positive for nausea. Negative for abdominal pain, constipation, diarrhea and vomiting.  Musculoskeletal: Positive for myalgias. Negative for arthralgias.  Neurological: Negative for dizziness and headaches.     Physical Exam Triage Vital Signs ED Triage Vitals  Enc Vitals Group     BP      Pulse      Resp  Temp      Temp src      SpO2      Weight      Height      Head Circumference      Peak Flow      Pain Score      Pain Loc      Pain Edu?      Excl. in [place]?    No data found.  Updated Vital Signs BP 135/86 (BP Location: Left Arm)   Pulse (!) 101   Temp 98.2 F (36.8 C) (Oral)   Resp 18   SpO2 98%   Visual Acuity Right Eye Distance:   Left Eye Distance:   Bilateral Distance:     Right Eye Near:   Left Eye Near:    Bilateral Near:     Physical Exam Constitutional:      General: She is not in acute distress.    Appearance: She is normal weight. She is not toxic-appearing.  HENT:     Head: Normocephalic and atraumatic.     Jaw: There is normal jaw occlusion. No tenderness or pain on movement.     Right Ear: Hearing, tympanic membrane, ear canal and external ear normal. No tenderness. No mastoid tenderness.     Left Ear: Hearing, tympanic membrane, ear canal and external ear normal. No tenderness. No mastoid tenderness.     Nose: No nasal deformity, septal deviation or nasal tenderness.     Right Turbinates: Not swollen or pale.     Left Turbinates: Not swollen or pale.     Right Sinus: No maxillary sinus tenderness or frontal sinus tenderness.     Left Sinus: No maxillary sinus tenderness or frontal sinus tenderness.     Comments: Bilateral sinus tenderness present    Mouth/Throat:     Lips: Pink. No lesions.     Mouth: Mucous membranes are moist. No injury.     Pharynx: Oropharynx is clear. Uvula midline. No posterior oropharyngeal erythema or uvula swelling.     Comments: no tonsillar exudate or hypertrophy Eyes:     General: No scleral icterus.    Conjunctiva/sclera: Conjunctivae normal.     Pupils: Pupils are equal, round, and reactive to light.  Neck:     Musculoskeletal: Normal range of motion and neck supple. No muscular tenderness.  Cardiovascular:     Rate and Rhythm: Normal rate and regular rhythm.     Comments: HR 94-98 w/ provider Pulmonary:     Effort: Pulmonary effort is normal. No respiratory distress.     Breath sounds: No wheezing.  Lymphadenopathy:     Cervical: Cervical adenopathy present.  Skin:    General: Skin is warm.     Capillary Refill: Capillary refill takes less than 2 seconds.     Findings: No erythema or rash.  Neurological:     General: No focal deficit present.     Mental Status: She is alert and oriented to person,  place, and time.      UC Treatments / Results  Labs (all labs ordered are listed, but only abnormal results are displayed) Labs Reviewed  POCT INFLUENZA A/B - Normal  NOVEL CORONAVIRUS, NAA    EKG   Radiology No results found.  Procedures Procedures (including critical care time)  Medications Ordered in UC Medications - No data to display  Initial Impression / Assessment and Plan / UC Course  I have reviewed the triage vital signs and the nursing notes.  Pertinent  labs & imaging results that were available during my care of the patient were reviewed by me and considered in my medical decision making (see chart for details).     Patient afebrile, nontoxic in office today.  Rapid flu A/B-, Covid PCR pending.  Given significant sinus pressure/pain in conjunction with annual history of recurrent ethmoid/maxillary sinusitis, will start Augmentin today.  Patient reports yeast infections status post antibiotic use: Diflucan sent.  Will trial Zofran for nausea to continue adequate oral hydration.  Return precautions discussed, patient verbalized understanding and is agreeable to plan. Final Clinical Impressions(s) / UC Diagnoses   Final diagnoses:  Nasal congestion  Acute recurrent maxillary sinusitis     Discharge Instructions     Your COVID test is pending - it is important to quarantine / isolate at home until your results are back. If you test positive and would like further evaluation for persistent or worsening symptoms, you may schedule an E-visit or virtual (video) visit throughout the [facility] app or website.  PLEASE NOTE: If you develop severe chest pain or shortness of breath please go to the ER or call 9-1-1 for further evaluation --> DO NOT schedule electronic or virtual visits for this. Please call our office for further guidance / recommendations as needed.    ED Prescriptions    Medication Sig Dispense Auth. Provider   amoxicillin-clavulanate  (AUGMENTIN) 875-125 MG tablet Take 1 tablet by mouth 2 (two) times daily. X 10 days 20 tablet Hall-Potvin, [place], PA-C   fluconazole (DIFLUCAN) 150 MG tablet Take 1 tablet (150 mg total) by mouth daily. 2 tablet Hall-Potvin, [place], PA-C   ondansetron (ZOFRAN ODT) 4 MG disintegrating tablet Take 1 tablet (4 mg total) by mouth every 8 (eight) hours as needed for nausea or vomiting. 21 tablet Hall-Potvin, [place], PA-C     PDMP not reviewed this encounter.   Hall-Potvin, [place], PA-C 11/15/19 1256    Hall-Potvin, [place], [place] 11/15/19 1256

## 2019-11-17 LAB — NOVEL CORONAVIRUS, NAA: SARS-CoV-2, NAA: NOT DETECTED

## 2019-11-28 ENCOUNTER — Ambulatory Visit
Admission: EM | Admit: 2019-11-28 | Discharge: 2019-11-28 | Disposition: A | Discharge status: Home / Self Care | Payer: 59 | Attending: Physician Assistant | Admitting: Physician Assistant

## 2019-11-28 DIAGNOSIS — Z20828 Contact with and (suspected) exposure to other viral communicable diseases: Secondary | ICD-10-CM | POA: Diagnosis not present

## 2019-11-28 DIAGNOSIS — J3489 Other specified disorders of nose and nasal sinuses: Secondary | ICD-10-CM | POA: Diagnosis not present

## 2019-11-28 MED ORDER — PREDNISONE 50 MG PO TABS: 50.0000 mg | ORAL_TABLET | Freq: Every day | ORAL | 0 refills | Status: DC

## 2019-11-28 MED ORDER — AZELASTINE HCL 0.1 % NA SOLN: 2.0000 | Freq: Two times a day (BID) | NASAL | 3 refills | Status: DC

## 2019-11-28 NOTE — ED Triage Notes (Signed)
Pt sinus pressure and scratchy throat x3 days.

## 2019-11-28 NOTE — ED Provider Notes (Signed)
[name]    CSN: [id number] Arrival date & time: 11/28/19  1326      History   Chief Complaint Chief Complaint  Patient presents with  . Sore Throat    HPI Claudia Duran is a 48 y.o. female.   48 year old female comes in for 2-day history of sinus pressure, postnasal drip, nasal congestion.  She was seen 11/15/2019 for similar symptoms.  At that time, she was started on Augmentin, and was tested for Covid.  Covid testing negative.  Her symptoms completely resolved prior to current symptom onset.  She denies fever, chills, body aches.  Denies chest pain, shortness of breath, loss of taste or smell.  Denies abdominal pain, nausea, vomiting, diarrhea.  Patient works as a Games developer, and has had positive Covid contacts.  Work requires Covid testing prior to returning to work.     History reviewed. No pertinent past medical history.  There are no active problems to display for this patient.   History reviewed. No pertinent surgical history.  OB History   No obstetric history on file.      Home Medications    Prior to Admission medications   Medication Sig Start Date End Date Taking? Authorizing Provider  azelastine (ASTELIN) 0.1 % nasal spray Place 2 sprays into both nostrils 2 (two) times daily. 11/28/19   Ok Edwards, PA-C  etonogestrel-ethinyl estradiol (NUVARING) 0.12-0.015 MG/24HR vaginal ring INSERT VAGINALLY AND LEAVE IN PLACE FOR 3 CONSECUTIVE WEEKS, THEN REMOVE FOR 1 WEEK. 09/28/18   [provider]  fluticasone (FLONASE) 50 MCG/ACT nasal spray Place 2 sprays into both nostrils daily. Patient not taking: Reported on 10/18/2018 12/25/16   Melynda Ripple, MD  ibuprofen (ADVIL,MOTRIN) 800 MG tablet Take 1 tablet (800 mg total) by mouth 3 (three) times daily. Patient not taking: Reported on 10/18/2018 12/25/16   Melynda Ripple, MD  ondansetron (ZOFRAN ODT) 4 MG disintegrating tablet Take 1 tablet (4 mg total) by mouth every 8 (eight) hours as  needed for nausea or vomiting. 11/15/19   Hall-Potvin, [place], PA-C  predniSONE (DELTASONE) 50 MG tablet Take 1 tablet (50 mg total) by mouth daily with breakfast. 11/28/19   Ok Edwards, PA-C    Family History Family History  Problem Relation Age of Onset  . Healthy Mother   . Healthy Father     Social History Social History   Tobacco Use  . Smoking status: Never Smoker  . Smokeless tobacco: Never Used  Substance Use Topics  . Alcohol use: Not Currently  . Drug use: Never     Allergies   Oseltamivir phosphate and Aspirin   Review of Systems Review of Systems  Reason unable to perform ROS: See HPI as above.     Physical Exam Triage Vital Signs ED Triage Vitals  Enc Vitals Group     BP      Pulse      Resp      Temp      Temp src      SpO2      Weight      Height      Head Circumference      Peak Flow      Pain Score      Pain Loc      Pain Edu?      Excl. in [place]?    No data found.  Updated Vital Signs BP 134/84 (BP Location: Left Arm)   Pulse 94   Temp 97.9  F (36.6 C) (Oral)   Resp 18   LMP 11/02/2019   SpO2 96%   Physical Exam Constitutional:      General: She is not in acute distress.    Appearance: Normal appearance. She is not ill-appearing, toxic-appearing or diaphoretic.  HENT:     Head: Normocephalic and atraumatic.     Right Ear: Tympanic membrane, ear canal and external ear normal. Tympanic membrane is not erythematous or bulging.     Left Ear: Tympanic membrane, ear canal and external ear normal. Tympanic membrane is not erythematous or bulging.     Nose:     Right Sinus: Maxillary sinus tenderness and frontal sinus tenderness present.     Left Sinus: Maxillary sinus tenderness and frontal sinus tenderness present.     Mouth/Throat:     Mouth: Mucous membranes are moist.     Pharynx: Oropharynx is clear. Uvula midline.  Neck:     Musculoskeletal: Normal range of motion and neck supple.  Cardiovascular:     Rate and Rhythm:  Normal rate and regular rhythm.     Heart sounds: Normal heart sounds. No murmur. No friction rub. No gallop.   Pulmonary:     Effort: Pulmonary effort is normal. No accessory muscle usage, prolonged expiration, respiratory distress or retractions.     Comments: Lungs clear to auscultation without adventitious lung sounds. Neurological:     General: No focal deficit present.     Mental Status: She is alert and oriented to person, place, and time.      UC Treatments / Results  Labs (all labs ordered are listed, but only abnormal results are displayed) Labs Reviewed  NOVEL CORONAVIRUS, NAA    EKG   Radiology No results found.  Procedures Procedures (including critical care time)  Medications Ordered in UC Medications - No data to display  Initial Impression / Assessment and Plan / UC Course  I have reviewed the triage vital signs and the nursing notes.  Pertinent labs & imaging results that were available during my care of the patient were reviewed by me and considered in my medical decision making (see chart for details).    Covid testing ordered.  Patient to remain in quarantine until testing results return.  Discussed low suspicion for bacterial sinusitis given recent course of antibiotics, 2-day history of symptoms.  At this time, we will start prednisone and azelastine for symptomatic management.  Patient with history of adverse reaction to Nasonex nasal spray, can try Flonase for symptoms if needed.  Return precautions given.  Patient expresses understanding and agrees to plan.  Final Clinical Impressions(s) / UC Diagnoses   Final diagnoses:  Sinus pressure   ED Prescriptions    Medication Sig Dispense Auth. Provider   predniSONE (DELTASONE) 50 MG tablet Take 1 tablet (50 mg total) by mouth daily with breakfast. 5 tablet Yu, Amy V, PA-C   azelastine (ASTELIN) 0.1 % nasal spray Place 2 sprays into both nostrils 2 (two) times daily. 30 mL Belinda Fisher, PA-C     PDMP  not reviewed this encounter.   Belinda Fisher, PA-C 11/28/19 1630

## 2019-11-28 NOTE — Discharge Instructions (Signed)
COVID testing ordered. I would like you to quarantine until testing results. Prednisone and azelastine as directed. If experiencing shortness of breath, trouble breathing, go to the emergency department for further evaluation needed.

## 2019-11-30 LAB — NOVEL CORONAVIRUS, NAA: SARS-CoV-2, NAA: NOT DETECTED

## 2020-01-29 MED FILL — ETONOGESTREL-ETHINYL ESTRAD: 0.12-0.015 | 84 days supply | Qty: 3 | Fill #1

## 2020-02-26 ENCOUNTER — Ambulatory Visit
Admission: EM | Admit: 2020-02-26 | Discharge: 2020-02-26 | Disposition: A | Discharge status: Home / Self Care | Payer: 59 | Attending: Emergency Medicine | Admitting: Emergency Medicine

## 2020-02-26 ENCOUNTER — Encounter: Payer: Self-pay | Admitting: Emergency Medicine

## 2020-02-26 ENCOUNTER — Telehealth: Payer: Self-pay

## 2020-02-26 DIAGNOSIS — U071 COVID-19: Secondary | ICD-10-CM

## 2020-02-26 LAB — POC SARS CORONAVIRUS 2 AG -  ED: SARS Coronavirus 2 Ag: POSITIVE — AB

## 2020-02-26 LAB — POCT INFLUENZA A/B
Influenza A, POC: NEGATIVE
Influenza B, POC: NEGATIVE

## 2020-02-26 MED ORDER — PROMETHAZINE-DM 6.25-15 MG/5ML PO SYRP: 5.0000 mL | ORAL_SOLUTION | Freq: Four times a day (QID) | ORAL | 0 refills | Status: DC | PRN

## 2020-02-26 MED ORDER — FAMOTIDINE 20 MG PO TABS: 20.0000 mg | ORAL_TABLET | Freq: Every day | ORAL | 0 refills | Status: DC

## 2020-02-26 NOTE — ED Triage Notes (Signed)
Pt c/o fever, headache, neck pain, throat pain and congestion

## 2020-02-26 NOTE — ED Provider Notes (Signed)
[name]    CSN: [id number] Arrival date & time: 02/26/20  1713      History   Chief Complaint Chief Complaint  Patient presents with  . Headache    HPI Claudia Duran is a 49 y.o. female without significant medical history presenting for fever, frontal headache, myalgias, sore throat, congestion since Saturday.  States she had severe worsening of symptoms yesterday.  Unknown Tmax.  Denies nausea, vomiting, abdominal pain, chest pain, palpitations, difficulty breathing.  Denies cough, diarrhea, change in urination.  Able to keep down fluids, though has had decreased appetite and fatigue.  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  Has been taking ibuprofen, Tylenol as needed for myalgias with moderate relief.   History reviewed. No pertinent past medical history.  There are no problems to display for this patient.   History reviewed. No pertinent surgical history.  OB History   No obstetric history on file.      Home Medications    Prior to Admission medications   Medication Sig Start Date End Date Taking? Authorizing Provider  etonogestrel-ethinyl estradiol (NUVARING) 0.12-0.015 MG/24HR vaginal ring INSERT VAGINALLY AND LEAVE IN PLACE FOR 3 CONSECUTIVE WEEKS, THEN REMOVE FOR 1 WEEK. 09/28/18   [provider]  famotidine (PEPCID) 20 MG tablet Take 1 tablet (20 mg total) by mouth daily. 02/26/20   Hall-Potvin, [place], PA-C  promethazine-dextromethorphan (PROMETHAZINE-DM) 6.25-15 MG/5ML syrup Take 5 mLs by mouth 4 (four) times daily as needed for cough. 02/26/20   Hall-Potvin, [place], PA-C    Family History Family History  Problem Relation Age of Onset  . Healthy Mother   . Healthy Father     Social History Social History   Tobacco Use  . Smoking status: Never Smoker  . Smokeless tobacco: Never Used  Substance Use Topics  . Alcohol use: Not Currently  . Drug use: Never     Allergies   Oseltamivir phosphate  and Aspirin   Review of Systems As per HPI   Physical Exam Triage Vital Signs ED Triage Vitals  Enc Vitals Group     BP      Pulse      Resp      Temp      Temp src      SpO2      Weight      Height      Head Circumference      Peak Flow      Pain Score      Pain Loc      Pain Edu?      Excl. in [place]?    No data found.  Updated Vital Signs BP 137/85 (BP Location: Left Arm)   Pulse 76   Temp 98 F (36.7 C) (Oral)   Resp 18   SpO2 97%   Visual Acuity Right Eye Distance:   Left Eye Distance:   Bilateral Distance:    Right Eye Near:   Left Eye Near:    Bilateral Near:     Physical Exam Constitutional:      General: She is not in acute distress.    Appearance: She is well-developed. She is obese. She is ill-appearing. She is not toxic-appearing or diaphoretic.  HENT:     Head: Normocephalic and atraumatic.     Mouth/Throat:     Mouth: Mucous membranes are moist.     Pharynx: Oropharynx is clear. No oropharyngeal exudate or posterior oropharyngeal erythema.  Eyes:  General: No scleral icterus.    Conjunctiva/sclera: Conjunctivae normal.     Pupils: Pupils are equal, round, and reactive to light.  Neck:     Comments: Trachea midline, negative JVD Cardiovascular:     Rate and Rhythm: Normal rate and regular rhythm.     Heart sounds: No murmur. No gallop.   Pulmonary:     Effort: Pulmonary effort is normal. No respiratory distress.     Breath sounds: No wheezing, rhonchi or rales.  Musculoskeletal:     Cervical back: Neck supple. No tenderness.  Lymphadenopathy:     Cervical: No cervical adenopathy.  Skin:    General: Skin is warm.     Capillary Refill: Capillary refill takes less than 2 seconds.     Coloration: Skin is not jaundiced or pale.     Findings: No rash.  Neurological:     General: No focal deficit present.     Mental Status: She is alert and oriented to person, place, and time.      UC Treatments / Results  Labs (all labs ordered  are listed, but only abnormal results are displayed) Labs Reviewed  POC SARS CORONAVIRUS 2 AG -  ED - Abnormal; Notable for the following components:      Result Value   SARS Coronavirus 2 Ag Positive (*)    All other components within normal limits  POCT INFLUENZA A/B    EKG   Radiology No results found.  Procedures Procedures (including critical care time)  Medications Ordered in UC Medications - No data to display  Initial Impression / Assessment and Plan / UC Course  I have reviewed the triage vital signs and the nursing notes.  Pertinent labs & imaging results that were available during my care of the patient were reviewed by me and considered in my medical decision making (see chart for details).     Patient afebrile, nontoxic in office today.  SPO2 97%.  Rapid influenza A/B-.  COVID-19 positive.  Reviewed findings with patient who verbalized understanding.  No respiratory distress, cough.  Will provide cough syrup should she develop cough.  Will defer albuterol at this time due to adequate breathing.  Patient to start taking Singulair, famotidine, vitamin supplementation if desired.  Work note provided.  Return precautions discussed, patient verbalized understanding and is agreeable to plan. Final Clinical Impressions(s) / UC Diagnoses   Final diagnoses:  COVID-19 virus infection     Discharge Instructions     It is very important to remember that since you have tested positive for Covid you need to be staying home and quarantining to help keep others safe and healthy in the community.  If you would like further evaluation for persistent or worsening symptoms, you may schedule an E-visit or virtual (video) visit throughout the [facility] app or website.  PLEASE NOTE: If you develop severe chest pain or shortness of breath please go to the ER or call 9-1-1 for further evaluation --> DO NOT schedule electronic or virtual visits for this. Please call our office  for further guidance / recommendations as needed.  For information about the Covid vaccine, please visit [email or web address]    ED Prescriptions    Medication Sig Dispense Auth. Provider   promethazine-dextromethorphan (PROMETHAZINE-DM) 6.25-15 MG/5ML syrup Take 5 mLs by mouth 4 (four) times daily as needed for cough. 180 mL Hall-Potvin, [place], PA-C   famotidine (PEPCID) 20 MG tablet Take 1 tablet (20 mg total) by mouth daily. 30 tablet Hall-Potvin, [place],  PA-C     PDMP not reviewed this encounter.   Hall-Potvin, [place], [place] 02/26/20 1825

## 2020-02-26 NOTE — Discharge Instructions (Signed)
It is very important to remember that since you have tested positive for Covid you need to be staying home and quarantining to help keep others safe and healthy in the community.  If you would like further evaluation for persistent or worsening symptoms, you may schedule an E-visit or virtual (video) visit throughout the [facility] app or website.  PLEASE NOTE: If you develop severe chest pain or shortness of breath please go to the ER or call 9-1-1 for further evaluation --> DO NOT schedule electronic or virtual visits for this. Please call our office for further guidance / recommendations as needed.  For information about the Covid vaccine, please visit [email or web address] 

## 2020-03-06 ENCOUNTER — Ambulatory Visit (INDEPENDENT_AMBULATORY_CARE_PROVIDER_SITE_OTHER)
Admission: RE | Admit: 2020-03-06 | Discharge: 2020-03-06 | Disposition: A | Discharge status: Home / Self Care | Payer: 59 | Source: Ambulatory Visit

## 2020-03-06 DIAGNOSIS — J3489 Other specified disorders of nose and nasal sinuses: Secondary | ICD-10-CM

## 2020-03-06 DIAGNOSIS — J0110 Acute frontal sinusitis, unspecified: Secondary | ICD-10-CM

## 2020-03-06 DIAGNOSIS — U071 COVID-19: Secondary | ICD-10-CM | POA: Diagnosis not present

## 2020-03-06 MED ORDER — PREDNISONE 20 MG PO TABS: 20.0000 mg | ORAL_TABLET | Freq: Every day | ORAL | 0 refills | Status: DC

## 2020-03-06 NOTE — ED Provider Notes (Addendum)
Virtual Visit via Video Note:  Claudia Duran  initiated request for Telemedicine visit with [facility] team. I connected with Claudia Duran  on 03/06/2020 at 2:23 PM  for a synchronized telemedicine visit using a video enabled HIPPA compliant telemedicine application. I verified that I am speaking with Claudia Duran  using two identifiers. [place] Hall-Potvin, PA-C  was physically located in a [facility] site and Claudia Duran was located at a different location.   The limitations of evaluation and management by telemedicine as well as the availability of in-person appointments were discussed. Patient was informed that she  may incur a bill ( including co-pay) for this virtual visit encounter. Claudia Duran  expressed understanding and gave verbal consent to proceed with virtual visit.     History of Present Illness:Claudia Duran  is a 49 y.o. female with acute COVID-19 infection (postive test 02/26/20) presents with 4-5-day course of nasal pressure.  States she has had some associated nausea for which Zofran has not been relieving.  Has tried Mucinex D with minimal relief.  Patient denied vomiting, abdominal pain, fever, chest pain, cough, shortness of breath.      ROI as per HPI  History reviewed. No pertinent past medical history.  Allergies  Allergen Reactions  . Oseltamivir Phosphate Nausea And Vomiting  . Aspirin         Observations/Objective: 49 y.o. female Sitting in no acute distress.  Patient is able to speak in full sentences without coughing, sneezing, wheezing.  No obvious facial swelling, periorbital edema or ecchymosis.  Negative allergic salute, allergic shiners.  Assessment and Plan: H&P suspicious for sinusitis.  Patient does report improvement in sinus pressure when laying down.  Denies ear pain, throat pain, decreased hearing or fever.  Will treat with prednisone as likely sequela of COVID-19 infection.  Should patient have  worsening symptoms while on steroid, or have worsening of symptoms upon cessation, would consider antibiotics at that time.  Return precautions discussed, patient verbalized understanding and is agreeable to plan.  Follow Up Instructions: Patient to seek in-person evaluation for persistent, worsening symptoms.   I discussed the assessment and treatment plan with the patient. The patient was provided an opportunity to ask questions and all were answered. The patient agreed with the plan and demonstrated an understanding of the instructions.   The patient was advised to call back or seek an in-person evaluation if the symptoms worsen or if the condition fails to improve as anticipated.  I provided 15 minutes of non-face-to-face time during this encounter.    [place] Hall-Potvin, PA-C  03/06/2020 2:23 PM        Hall-Potvin, [place], PA-C 03/06/20 1423    Hall-Potvin, [place], [place] 03/06/20 1423

## 2020-03-06 NOTE — Discharge Instructions (Addendum)
Take steroid once daily with food. Continue home meds. Return for worsening pressure, fever, or symptoms return after finishing steroid.

## 2020-08-01 MED FILL — CLINDAMYCIN HCL 150 MG CAPS: 150 | 7 days supply | Qty: 28 | Fill #0

## 2020-08-12 MED FILL — CLINDAMYCIN HCL 150 MG CAPS: 150 | 7 days supply | Qty: 28 | Fill #0

## 2020-08-27 MED FILL — FLUCONAZOLE 100 MG TABLET: 100 | 3 days supply | Qty: 4 | Fill #0

## 2020-08-27 MED FILL — AMOX-CLAV 500-125 MG TABLET: 500-125 | 7 days supply | Qty: 15 | Fill #0

## 2020-08-27 MED FILL — HYDROCODON-APAP 5-325: 5-325 | 4 days supply | Qty: 20 | Fill #0

## 2020-11-18 ENCOUNTER — Other Ambulatory Visit (HOSPITAL_COMMUNITY): Payer: Self-pay | Admitting: Dentist

## 2020-11-18 MED FILL — CLINDAMYCIN HCL 150 MG CAPS: 150 | 7 days supply | Qty: 28 | Fill #0

## 2020-11-19 ENCOUNTER — Other Ambulatory Visit (HOSPITAL_COMMUNITY): Payer: Self-pay | Admitting: Physician Assistant

## 2020-11-19 MED FILL — HYDROCORTISONE ACETATE 25 M: 25 | 5 days supply | Qty: 10 | Fill #0

## 2020-12-07 ENCOUNTER — Other Ambulatory Visit: Payer: Self-pay

## 2020-12-07 ENCOUNTER — Ambulatory Visit
Admission: EM | Admit: 2020-12-07 | Discharge: 2020-12-07 | Disposition: A | Discharge status: Home / Self Care | Payer: 59 | Attending: Urgent Care | Admitting: Urgent Care

## 2020-12-07 DIAGNOSIS — R059 Cough, unspecified: Secondary | ICD-10-CM

## 2020-12-07 DIAGNOSIS — Z1152 Encounter for screening for COVID-19: Secondary | ICD-10-CM

## 2020-12-07 DIAGNOSIS — B349 Viral infection, unspecified: Secondary | ICD-10-CM | POA: Diagnosis not present

## 2020-12-07 DIAGNOSIS — R519 Headache, unspecified: Secondary | ICD-10-CM

## 2020-12-07 DIAGNOSIS — Z7689 Persons encountering health services in other specified circumstances: Secondary | ICD-10-CM | POA: Diagnosis not present

## 2020-12-07 MED ORDER — PROMETHAZINE-DM 6.25-15 MG/5ML PO SYRP: 5.0000 mL | ORAL_SOLUTION | Freq: Every evening | ORAL | 0 refills | Status: DC | PRN

## 2020-12-07 MED ORDER — BENZONATATE 100 MG PO CAPS: 100.0000 mg | ORAL_CAPSULE | Freq: Three times a day (TID) | ORAL | 0 refills | Status: DC | PRN

## 2020-12-07 MED ORDER — PREDNISONE 20 MG PO TABS: ORAL_TABLET | ORAL | 0 refills | Status: DC

## 2020-12-07 NOTE — ED Provider Notes (Signed)
[facility]   MRN: [id number] DOB: 07-14-1971  Subjective:   Claudia Duran is a 49 y.o. female presenting for 3 to 4-day history of chest tightness, pounding headache, sinus congestion and loose stools.  Patient has had a dramatic malaise.  She is use over-the-counter medications, TheraFlu, antihistamine with minimal relief.  Has a history of COVID 36, is already vaccinated. She has gotten her flu vaccine as well.  Denies smoking cigarettes.  Denies history of asthma or respiratory disorders.  No current facility-administered medications for this encounter.  Current Outpatient Medications:  .  etonogestrel-ethinyl estradiol (NUVARING) 0.12-0.015 MG/24HR vaginal ring, INSERT VAGINALLY AND LEAVE IN PLACE FOR 3 CONSECUTIVE WEEKS, THEN REMOVE FOR 1 WEEK., Disp: , Rfl:  .  famotidine (PEPCID) 20 MG tablet, Take 1 tablet (20 mg total) by mouth daily., Disp: 30 tablet, Rfl: 0   Allergies  Allergen Reactions  . Oseltamivir Phosphate Nausea And Vomiting  . Aspirin     History reviewed. No pertinent past medical history.   History reviewed. No pertinent surgical history.  Family History  Problem Relation Age of Onset  . Healthy Mother   . Healthy Father     Social History   Tobacco Use  . Smoking status: Never Smoker  . Smokeless tobacco: Never Used  Vaping Use  . Vaping Use: Never used  Substance Use Topics  . Alcohol use: Not Currently  . Drug use: Never    ROS  Objective:   Vitals: BP (!) 143/85 (BP Location: Right Arm)   Pulse 84   Temp 98 F (36.7 C) (Oral)   Resp 19   SpO2 96%   Physical Exam Constitutional:      General: She is not in acute distress.    Appearance: Normal appearance. She is well-developed. She is not ill-appearing, toxic-appearing or diaphoretic.  HENT:     Head: Normocephalic and atraumatic.  Eyes:     General: No scleral icterus.       Right eye: No discharge.        Left eye: No discharge.     Extraocular Movements:  Extraocular movements intact.     Conjunctiva/sclera: Conjunctivae normal.     Pupils: Pupils are equal, round, and reactive to light.  Cardiovascular:     Rate and Rhythm: Normal rate and regular rhythm.     Pulses: Normal pulses.     Heart sounds: Normal heart sounds. No murmur heard. No friction rub. No gallop.   Pulmonary:     Effort: Pulmonary effort is normal. No respiratory distress.     Breath sounds: Normal breath sounds. No stridor. No wheezing, rhonchi or rales.  Skin:    General: Skin is warm and dry.     Findings: No rash.  Neurological:     Mental Status: She is alert and oriented to person, place, and time.  Psychiatric:        Mood and Affect: Mood normal.        Behavior: Behavior normal.        Thought Content: Thought content normal.        Judgment: Judgment normal.      Assessment and Plan :   PDMP not reviewed this encounter.  1. Viral syndrome   2. Encounter for screening for COVID-19   3. Sinus headache   4. Cough     High suspicion for influenza but is outside the window to receive and get help with Xofluza.  Tamiflu is on her allergy  list.  Will manage for viral illness such as viral URI, viral syndrome, viral rhinitis, COVID-19. Counseled patient on nature of COVID-19 including modes of transmission, diagnostic testing, management and supportive care.  Offered scripts for symptomatic relief. COVID 19 testing is pending.  Discussed antibiotic stewardship and will hold off on antibiotic course for now.  Counseled patient on potential for adverse effects with medications prescribed/recommended today, ER and return-to-clinic precautions discussed, patient verbalized understanding.     Wallis Bamberg, PA-C 12/07/20 1215

## 2020-12-07 NOTE — Discharge Instructions (Signed)

## 2020-12-07 NOTE — ED Triage Notes (Signed)
Patient states she has had chest tightness, pounding headache, and congestion, and diarrhea x 3 to 4 days. Patient is aox4 and ambulatory.

## 2020-12-11 ENCOUNTER — Ambulatory Visit (HOSPITAL_COMMUNITY)
Admission: EM | Admit: 2020-12-11 | Discharge: 2020-12-11 | Disposition: A | Discharge status: Home / Self Care | Payer: 59 | Attending: Internal Medicine | Admitting: Internal Medicine

## 2020-12-11 ENCOUNTER — Other Ambulatory Visit: Payer: Self-pay

## 2020-12-11 DIAGNOSIS — Z20822 Contact with and (suspected) exposure to covid-19: Secondary | ICD-10-CM | POA: Insufficient documentation

## 2020-12-11 DIAGNOSIS — Z01812 Encounter for preprocedural laboratory examination: Secondary | ICD-10-CM | POA: Insufficient documentation

## 2020-12-11 LAB — SARS CORONAVIRUS 2 (TAT 6-24 HRS): SARS Coronavirus 2: NEGATIVE

## 2020-12-11 NOTE — ED Triage Notes (Signed)
Patient presents to [facility] for COVID testing

## 2020-12-11 NOTE — ED Notes (Signed)
Patient able to ambulate independently  

## 2020-12-12 LAB — COVID-19, FLU A+B NAA
Influenza A, NAA: NOT DETECTED
Influenza B, NAA: NOT DETECTED
SARS-CoV-2, NAA: NOT DETECTED

## 2020-12-23 ENCOUNTER — Other Ambulatory Visit (HOSPITAL_COMMUNITY): Payer: Self-pay | Admitting: Dentist

## 2020-12-23 MED FILL — CLINDAMYCIN HCL 150 MG CAPS: 150 | 7 days supply | Qty: 28 | Fill #0

## 2021-01-07 ENCOUNTER — Other Ambulatory Visit: Payer: 59

## 2021-02-26 ENCOUNTER — Ambulatory Visit: Payer: 59 | Admitting: Family Medicine

## 2021-03-05 ENCOUNTER — Ambulatory Visit: Payer: 59 | Admitting: Family Medicine

## 2021-03-25 ENCOUNTER — Other Ambulatory Visit (HOSPITAL_COMMUNITY): Payer: Self-pay

## 2021-03-25 DIAGNOSIS — R1011 Right upper quadrant pain: Secondary | ICD-10-CM | POA: Diagnosis not present

## 2021-03-25 DIAGNOSIS — Z1211 Encounter for screening for malignant neoplasm of colon: Secondary | ICD-10-CM | POA: Diagnosis not present

## 2021-03-25 DIAGNOSIS — G4700 Insomnia, unspecified: Secondary | ICD-10-CM | POA: Diagnosis not present

## 2021-03-25 DIAGNOSIS — Z0000 Encounter for general adult medical examination without abnormal findings: Secondary | ICD-10-CM | POA: Diagnosis not present

## 2021-03-25 MED ORDER — TRAZODONE HCL 50 MG PO TABS
50.0000 mg | ORAL_TABLET | Freq: Every evening | ORAL | 0 refills | Status: DC
  Filled 2021-03-25: qty 30, 30d supply, fill #0

## 2021-03-26 ENCOUNTER — Other Ambulatory Visit (HOSPITAL_COMMUNITY): Payer: Self-pay

## 2021-03-27 DIAGNOSIS — R1011 Right upper quadrant pain: Secondary | ICD-10-CM | POA: Diagnosis not present

## 2021-04-01 DIAGNOSIS — Z1231 Encounter for screening mammogram for malignant neoplasm of breast: Secondary | ICD-10-CM | POA: Diagnosis not present

## 2021-04-30 DIAGNOSIS — R928 Other abnormal and inconclusive findings on diagnostic imaging of breast: Secondary | ICD-10-CM | POA: Diagnosis not present

## 2021-04-30 DIAGNOSIS — R922 Inconclusive mammogram: Secondary | ICD-10-CM | POA: Diagnosis not present

## 2021-05-14 ENCOUNTER — Ambulatory Visit: Payer: 59 | Admitting: Internal Medicine

## 2021-05-14 ENCOUNTER — Other Ambulatory Visit: Payer: Self-pay

## 2021-05-14 ENCOUNTER — Ambulatory Visit (INDEPENDENT_AMBULATORY_CARE_PROVIDER_SITE_OTHER): Payer: 59

## 2021-05-14 ENCOUNTER — Encounter: Payer: Self-pay | Admitting: Internal Medicine

## 2021-05-14 VITALS — BP 110/80 | HR 86 | Ht 68.0 in | Wt 165.0 lb

## 2021-05-14 DIAGNOSIS — R0602 Shortness of breath: Secondary | ICD-10-CM | POA: Diagnosis not present

## 2021-05-14 DIAGNOSIS — R079 Chest pain, unspecified: Secondary | ICD-10-CM | POA: Diagnosis not present

## 2021-05-14 DIAGNOSIS — R002 Palpitations: Secondary | ICD-10-CM

## 2021-05-14 DIAGNOSIS — E7800 Pure hypercholesterolemia, unspecified: Secondary | ICD-10-CM | POA: Diagnosis not present

## 2021-05-14 NOTE — Patient Instructions (Signed)
Medication Instructions:  - Your physician recommends that you continue on your current medications as directed. Please refer to the Current Medication list given to you today.  *If you need a refill on your cardiac medications before your next appointment, please call your pharmacy*   Lab Work: - none ordered  If you have labs (blood work) drawn today and your tests are completely normal, you will receive your results only by: Marland Kitchen MyChart Message (if you have MyChart) OR . A paper copy in the mail If you have any lab test that is abnormal or we need to change your treatment, we will call you to review the results.   Testing/Procedures: 1) Echocardiogram: - Your physician has requested that you have an echocardiogram (after 05/28/21). Echocardiography is a painless test that uses sound waves to create images of your heart. It provides your doctor with information about the size and shape of your heart and how well your heart's chambers and valves are working. This procedure takes approximately one hour. There are no restrictions for this procedure.   2) ZIO XT (heart monitor)  - Your physician has recommended that you wear a Zio XT (heart) monitor x 14 days- to be placed in office today.   This monitor is a medical device that records the heart's electrical activity. Doctors most often use these monitors to diagnose arrhythmias. Arrhythmias are problems with the speed or rhythm of the heartbeat. The monitor is a small device applied to your chest. You can wear one while you do your normal daily activities. While wearing this monitor if you have any symptoms to push the button and record what you felt. Once you have worn this monitor for the period of time provider prescribed (Usually 14 days), you will return the monitor device in the postage paid box. Once it is returned they will download the data collected and provide [place] with a report which the provider will then review and we will call you  with those results. Important tips:  1. Avoid showering during the first 24 hours of wearing the monitor. 2. Avoid excessive sweating to help maximize wear time. 3. Do not submerge the device, no hot tubs, and no swimming pools. 4. Keep any lotions or oils away from the patch. 5. After 24 hours you may shower with the patch on. Take brief showers with your back facing the shower head.  6. Do not remove patch once it has been placed because that will interrupt data and decrease adhesive wear time. 7. Push the button when you have any symptoms and write down what you were feeling. 8. Once you have completed wearing your monitor, remove and place into box which has postage paid and place in your outgoing mailbox.  9. If for some reason you have misplaced your box then call our office and we can provide another box and/or mail it off for you.     Follow-Up: At [facility], you and your health needs are our priority.  As part of our continuing mission to provide you with exceptional heart care, we have created designated Provider Care Teams.  These Care Teams include your primary Cardiologist (physician) and Advanced Practice Providers (APPs -  Physician Assistants and Nurse Practitioners) who all work together to provide you with the care you need, when you need it.  We recommend signing up for the patient portal called "MyChart".  Sign up information is provided on this After Visit Summary.  MyChart is used to connect  with patients for Virtual Visits (Telemedicine).  Patients are able to view lab/test results, encounter notes, upcoming appointments, etc.  Non-urgent messages can be sent to your provider as well.   To learn more about what you can do with MyChart, go to [email or web address].    Your next appointment:   6 week(s)  The format for your next appointment:   In Person  Provider:   You may see Yvonne Kendall, MD or one of the following Advanced Practice Providers on your  designated Care Team:    Nicolasa Ducking, NP  Eula Listen, PA-C  Marisue Ivan, PA-C  Cadence [place], [place]  Gillian Shields, NP    Other Instructions   Echocardiogram An echocardiogram is a test that uses sound waves (ultrasound) to produce images of the heart. Images from an echocardiogram can provide important information about:  Heart size and shape.  The size and thickness and movement of your heart's walls.  Heart muscle function and strength.  Heart valve function or if you have stenosis. Stenosis is when the heart valves are too narrow.  If blood is flowing backward through the heart valves (regurgitation).  A tumor or infectious growth around the heart valves.  Areas of heart muscle that are not working well because of poor blood flow or injury from a heart attack.  Aneurysm detection. An aneurysm is a weak or damaged part of an artery wall. The wall bulges out from the normal force of blood pumping through the body. Tell a health care provider about:  Any allergies you have.  All medicines you are taking, including vitamins, herbs, eye drops, creams, and over-the-counter medicines.  Any blood disorders you have.  Any surgeries you have had.  Any medical conditions you have.  Whether you are pregnant or may be pregnant. What are the risks? Generally, this is a safe test. However, problems may occur, including an allergic reaction to dye (contrast) that may be used during the test. What happens before the test? No specific preparation is needed. You may eat and drink normally. What happens during the test?  You will take off your clothes from the waist up and put on a hospital gown.  Electrodes or electrocardiogram (ECG)patches may be placed on your chest. The electrodes or patches are then connected to a device that monitors your heart rate and rhythm.  You will lie down on a table for an ultrasound exam. A gel will be applied to your chest to help  sound waves pass through your skin.  A handheld device, called a transducer, will be pressed against your chest and moved over your heart. The transducer produces sound waves that travel to your heart and bounce back (or "echo" back) to the transducer. These sound waves will be captured in real-time and changed into images of your heart that can be viewed on a video monitor. The images will be recorded on a computer and reviewed by your health care provider.  You may be asked to change positions or hold your breath for a short time. This makes it easier to get different views or better views of your heart.  In some cases, you may receive contrast through an IV in one of your veins. This can improve the quality of the pictures from your heart. The procedure may vary among health care providers and hospitals.   What can I expect after the test? You may return to your normal, everyday life, including diet, activities, and medicines, unless your health care  provider tells you not to do that. Follow these instructions at home:  It is up to you to get the results of your test. Ask your health care provider, or the department that is doing the test, when your results will be ready.  Keep all follow-up visits. This is important. Summary  An echocardiogram is a test that uses sound waves (ultrasound) to produce images of the heart.  Images from an echocardiogram can provide important information about the size and shape of your heart, heart muscle function, heart valve function, and other possible heart problems.  You do not need to do anything to prepare before this test. You may eat and drink normally.  After the echocardiogram is completed, you may return to your normal, everyday life, unless your health care provider tells you not to do that. This information is not intended to replace advice given to you by your health care provider. Make sure you discuss any questions you have with your health  care provider. Document Revised: 07/30/2020 Document Reviewed: 07/30/2020 Elsevier Patient Education  2021 [employer].

## 2021-05-14 NOTE — Progress Notes (Signed)
New Outpatient Visit Date: 05/14/2021  Primary Care provider: Eartha Inch, MD [address] [place],  [place] [address]  Chief Complaint: Chest pain  HPI:  [name] is a 50 y.o. female who is being seen today as a self-referral for evaluation of chest pain.  She has a history of hyperlipidemia.  She reports intermittent palpitations and chest pain present for more than a year.  She notes 1 episode while working into [facility] where she had sustained palpitations for several minutes.  They do not last this long now, though she reports feeling a "sticking" sensation every few seconds that can last up to 10 minutes.  At times, the sensation makes her feel like she cannot take a deep breath.  She can go a few months without symptoms and then have it several days in a row.  Sometimes, the pain is "pinching" and radiates to her back.  It is not exertional.  Aside from when she is experiencing these episodes, she has not had any shortness of breath nor lightheadedness, orthopnea, PND, or edema.  She has done EKGs on herself in the setting of palpitations but has not been able to capture an arrhythmia.  Recent EKG done here in the PACU made mention of LVH.  --------------------------------------------------------------------------------------------------  Cardiovascular History & Procedures: Cardiovascular Problems:  Palpitations  Chest pain  Risk Factors:  Hyperlipidemia and prior tobacco use  Cath/PCI:  None  CV Surgery:  None  EP Procedures and Devices:  None  Non-Invasive Evaluation(s):  None  --------------------------------------------------------------------------------------------------  Past Medical History:  Diagnosis Date  . Hyperlipidemia     Past Surgical History:  Procedure Laterality Date  . CESAREAN SECTION      Current Meds  Medication Sig  . Biotin 10 MG TABS Take 1 tablet by mouth daily.  . traZODone (DESYREL) 50 MG tablet Take 1 tablet  by mouth at bedtime.    Allergies: Oseltamivir phosphate and Aspirin  Social History   Tobacco Use  . Smoking status: Former Smoker    Packs/day: 0.30    Years: 12.00    Pack years: 3.60    Types: Cigarettes    Quit date: 2010    Years since quitting: 12.4  . Smokeless tobacco: Never Used  Vaping Use  . Vaping Use: Never used  Substance Use Topics  . Alcohol use: Not Currently    Comment: once a month  . Drug use: Never    Family History  Problem Relation Age of Onset  . Healthy Mother   . Heart disease Maternal Grandmother     Review of Systems: A 12-system review of systems was performed and was negative except as noted in the HPI.  --------------------------------------------------------------------------------------------------  Physical Exam: BP 110/80 (BP Location: Right Arm, Patient Position: Sitting, Cuff Size: Normal)   Pulse 86   Ht 5\' 8"  (1.727 m)   Wt 165 lb (74.8 kg)   SpO2 97%   BMI 25.09 kg/m   General:  NAD. HEENT: No conjunctival pallor or scleral icterus. Facemask in place. Neck: Supple without lymphadenopathy, thyromegaly, JVD, or HJR. No carotid bruit. Lungs: Normal work of breathing. Clear to auscultation bilaterally without wheezes or crackles. Heart: Regular rate and rhythm without murmurs, rubs, or gallops. Non-displaced PMI. Abd: Bowel sounds present. Soft, NT/ND without hepatosplenomegaly Ext: No lower extremity edema. Radial, PT, and DP pulses are 2+ bilaterally Skin: Warm and dry without rash. Neuro: CNIII-XII intact. Strength and fine-touch sensation intact in upper and lower extremities bilaterally.  Psych: Normal mood and affect.  EKG: Normal sinus rhythm with baseline wander.  No significant abnormality.  Outside labs (03/25/2021): CBC: WBC 8.2, Hgb 14.5, HCT 44.1, PLT 281  CMP: Sodium 137, potassium 3.9, chloride 100, CO2 22, BUN 8, creatinine 0.8, glucose 97, calcium, 9.5, AST 14, ALT 14, alkaline phosphatase 47, total  bilirubin 0.9, total protein 7.1, albumin 4.7  Lipid panel: Total cholesterol 209, triglycerides 134, HDL 51, LDL 134  TSH: 2.430 --------------------------------------------------------------------------------------------------  ASSESSMENT AND PLAN: Palpitations, chest pain, and shortness of breath: Claudia Duran reports intermittent palpitations for more than a year, typically consisting of brief "pinching or sticking" sensation in the chest.  Description is most consistent with PACs or PVCs.  Her a physical examination today and EKG are unremarkable.  We have agreed to obtain a 14-day event monitor for further characterization, as well as a transthoracic echocardiogram to exclude structural abnormalities in the setting of chest pain and shortness of breath.  We will defer medication changes today.  I have encouraged [name] to minimize her caffeine and alcohol intake.  Hyperlipidemia: Recent labs through her PCP were notable for mildly elevated LDL and total cholesterol (134 and 209, respectively).  Continue to work on lifestyle modifications.  No indication for pharmacotherapy at this time.  Follow-up: Return to clinic in 6 weeks.  Yvonne Kendall, MD 05/16/2021 2:06 PM

## 2021-05-15 ENCOUNTER — Other Ambulatory Visit (HOSPITAL_COMMUNITY): Payer: Self-pay

## 2021-05-15 MED ORDER — HYDROCORTISONE ACETATE 25 MG RE SUPP
25.0000 mg | Freq: Two times a day (BID) | RECTAL | 0 refills | Status: AC
  Filled 2021-05-15: qty 10, 5d supply, fill #0

## 2021-05-16 ENCOUNTER — Encounter: Payer: Self-pay | Admitting: Internal Medicine

## 2021-05-16 ENCOUNTER — Other Ambulatory Visit (HOSPITAL_COMMUNITY): Payer: Self-pay

## 2021-05-16 DIAGNOSIS — E7800 Pure hypercholesterolemia, unspecified: Secondary | ICD-10-CM | POA: Insufficient documentation

## 2021-05-16 DIAGNOSIS — R0602 Shortness of breath: Secondary | ICD-10-CM | POA: Insufficient documentation

## 2021-05-16 DIAGNOSIS — R079 Chest pain, unspecified: Secondary | ICD-10-CM | POA: Insufficient documentation

## 2021-05-16 DIAGNOSIS — R002 Palpitations: Secondary | ICD-10-CM | POA: Insufficient documentation

## 2021-05-21 ENCOUNTER — Other Ambulatory Visit (HOSPITAL_COMMUNITY): Payer: Self-pay

## 2021-05-27 DIAGNOSIS — R002 Palpitations: Secondary | ICD-10-CM | POA: Diagnosis not present

## 2021-06-11 DIAGNOSIS — Z1211 Encounter for screening for malignant neoplasm of colon: Secondary | ICD-10-CM | POA: Diagnosis not present

## 2021-06-11 DIAGNOSIS — K648 Other hemorrhoids: Secondary | ICD-10-CM | POA: Diagnosis not present

## 2021-06-24 ENCOUNTER — Other Ambulatory Visit: Payer: 59

## 2021-06-26 ENCOUNTER — Ambulatory Visit: Payer: 59 | Admitting: Medical

## 2021-10-27 ENCOUNTER — Other Ambulatory Visit (HOSPITAL_COMMUNITY): Payer: Self-pay

## 2021-10-27 MED ORDER — TRAZODONE HCL 50 MG PO TABS
50.0000 mg | ORAL_TABLET | Freq: Every day | ORAL | 0 refills | Status: DC
  Filled 2021-10-27: qty 30, 30d supply, fill #0

## 2021-11-07 ENCOUNTER — Encounter: Payer: Self-pay | Admitting: Emergency Medicine

## 2021-11-07 ENCOUNTER — Other Ambulatory Visit: Payer: Self-pay

## 2021-11-07 ENCOUNTER — Ambulatory Visit
Admission: EM | Admit: 2021-11-07 | Discharge: 2021-11-07 | Disposition: A | Discharge status: Home / Self Care | Payer: 59 | Attending: Physician Assistant | Admitting: Physician Assistant

## 2021-11-07 DIAGNOSIS — J09X2 Influenza due to identified novel influenza A virus with other respiratory manifestations: Secondary | ICD-10-CM | POA: Diagnosis not present

## 2021-11-07 LAB — POCT INFLUENZA A/B
Influenza A, POC: POSITIVE — AB
Influenza B, POC: NEGATIVE

## 2021-11-07 MED ORDER — AMOXICILLIN-POT CLAVULANATE 875-125 MG PO TABS: 1.0000 | ORAL_TABLET | Freq: Two times a day (BID) | ORAL | 0 refills | Status: AC

## 2021-11-07 MED ORDER — HYDROCOD POLST-CPM POLST ER 10-8 MG/5ML PO SUER: 5.0000 mL | Freq: Two times a day (BID) | ORAL | 0 refills | Status: DC | PRN

## 2021-11-07 NOTE — ED Provider Notes (Signed)
[name]    CSN: [id number] Arrival date & time: 11/07/21  0805      History   Chief Complaint Chief Complaint  Patient presents with   Cough   Fever    HPI Claudia Duran is a 50 y.o. female.   Pt complains of sinus pressure.  Pt reports she also has body aches.  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  Pt reports she also thinks she has a sinus infection   The history is provided by the patient. No language interpreter was used.  Cough Cough characteristics:  Non-productive Sputum characteristics:  Nondescript Severity:  Moderate Onset quality:  Gradual Duration:  1 day Timing:  Constant Progression:  Worsening Chronicity:  New Smoker: no   Worsened by:  Nothing Ineffective treatments:  None tried Associated symptoms: fever   Fever Temp source:  Subjective Severity:  Moderate Timing:  Constant Progression:  Worsening Relieved by:  Nothing Worsened by:  Nothing Ineffective treatments:  None tried Associated symptoms: cough   Risk factors: sick contacts    Past Medical History:  Diagnosis Date   Hyperlipidemia     Patient Active Problem List   Diagnosis Date Noted   Chest pain of uncertain etiology 05/16/2021   Shortness of breath 05/16/2021   Palpitations 05/16/2021   Pure hypercholesterolemia 05/16/2021    Past Surgical History:  Procedure Laterality Date   CESAREAN SECTION      OB History   No obstetric history on file.      Home Medications    Prior to Admission medications   Medication Sig Start Date End Date Taking? Authorizing Provider  amoxicillin-clavulanate (AUGMENTIN) 875-125 MG tablet Take 1 tablet by mouth 2 (two) times daily for 10 days. 11/07/21 11/17/21 Yes Elson Areas, PA-C  chlorpheniramine-HYDROcodone (TUSSIONEX PENNKINETIC ER) 10-8 MG/5ML SUER Take 5 mLs by mouth every 12 (twelve) hours as needed for cough. 11/07/21  Yes Elson Areas, PA-C  Biotin 10 MG TABS Take 1 tablet by mouth daily.     [provider]  hydrocortisone (ANUSOL-HC) 25 MG suppository Unwrap and place 1 suppository (25 mg total) rectally every 12 (twelve) hours. 05/15/21     traZODone (DESYREL) 50 MG tablet Take 1 tablet by mouth at bedtime. 10/27/21       Family History Family History  Problem Relation Age of Onset   Healthy Mother    Heart disease Maternal Grandmother     Social History Social History   Tobacco Use   Smoking status: Former    Packs/day: 0.30    Years: 12.00    Pack years: 3.60    Types: Cigarettes    Quit date: 2010    Years since quitting: 12.8   Smokeless tobacco: Never  Vaping Use   Vaping Use: Never used  Substance Use Topics   Alcohol use: Not Currently    Comment: once a month   Drug use: Never     Allergies   Oseltamivir phosphate and Aspirin   Review of Systems Review of Systems  Constitutional:  Positive for fever.  Respiratory:  Positive for cough.   All other systems reviewed and are negative.   Physical Exam Triage Vital Signs ED Triage Vitals  Enc Vitals Group     BP 11/07/21 0815 (!) 151/94     Pulse Rate 11/07/21 0815 98     Resp 11/07/21 0815 16     Temp 11/07/21 0815 97.9 F (36.6 C)  Temp Source 11/07/21 0815 Oral     SpO2 11/07/21 0815 96 %     Weight --      Height --      Head Circumference --      Peak Flow --      Pain Score 11/07/21 0816 1     Pain Loc --      Pain Edu? --      Excl. in GC? --    No data found.  Updated Vital Signs BP (!) 151/94 (BP Location: Left Arm)   Pulse 98   Temp 97.9 F (36.6 C) (Oral)   Resp 16   SpO2 96%   Visual Acuity Right Eye Distance:   Left Eye Distance:   Bilateral Distance:    Right Eye Near:   Left Eye Near:    Bilateral Near:     Physical Exam Vitals and nursing note reviewed.  Constitutional:      Appearance: She is well-developed.  HENT:     Head: Normocephalic.     Mouth/Throat:     Mouth: Mucous membranes are moist.  Cardiovascular:     Rate and Rhythm:  Normal rate.  Pulmonary:     Effort: Pulmonary effort is normal.  Abdominal:     General: Abdomen is flat. There is no distension.  Musculoskeletal:        General: Normal range of motion.     Cervical back: Normal range of motion.  Neurological:     Mental Status: She is alert and oriented to person, place, and time.     UC Treatments / Results  Labs (all labs ordered are listed, but only abnormal results are displayed) Labs Reviewed  POCT INFLUENZA A/B - Abnormal; Notable for the following components:      Result Value   Influenza A, POC Positive (*)    All other components within normal limits    EKG   Radiology No results found.  Procedures Procedures (including critical care time)  Medications Ordered in UC Medications - No data to display  Initial Impression / Assessment and Plan / UC Course  I have reviewed the triage vital signs and the nursing notes.  Pertinent labs & imaging results that were available during my care of the patient were reviewed by me and considered in my medical decision making (see chart for details).     Influenza a positive.  I will also treat sinus infection Final Clinical Impressions(s) / UC Diagnoses   Final diagnoses:  Influenza due to identified novel influenza A virus with other respiratory manifestations   Discharge Instructions   None    ED Prescriptions     Medication Sig Dispense Auth. Provider   amoxicillin-clavulanate (AUGMENTIN) 875-125 MG tablet Take 1 tablet by mouth 2 (two) times daily for 10 days. 20 tablet Cheron Schaumann K, [place]   chlorpheniramine-HYDROcodone [facility]) 10-8 MG/5ML SUER Take 5 mLs by mouth every 12 (twelve) hours as needed for cough. 70 mL Elson Areas, [place]      PDMP not reviewed this encounter. An After Visit Summary was printed and given to the patient.    Elson Areas, [place] 11/07/21 [phone]

## 2021-11-07 NOTE — ED Triage Notes (Signed)
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 Reports headache, facial tenderness, body aches,nausea.

## 2021-11-11 ENCOUNTER — Telehealth (HOSPITAL_COMMUNITY): Payer: Self-pay | Admitting: Emergency Medicine

## 2021-11-11 MED ORDER — PREDNISONE 50 MG PO TABS: ORAL_TABLET | ORAL | 0 refills | Status: DC

## 2022-01-15 ENCOUNTER — Other Ambulatory Visit (HOSPITAL_COMMUNITY): Payer: Self-pay

## 2022-01-15 MED ORDER — LORAZEPAM 0.5 MG PO TABS
0.5000 mg | ORAL_TABLET | Freq: Three times a day (TID) | ORAL | 0 refills | Status: DC | PRN
  Filled 2022-01-15: qty 30, 10d supply, fill #0

## 2022-02-26 ENCOUNTER — Other Ambulatory Visit
Admission: RE | Admit: 2022-02-26 | Discharge: 2022-02-26 | Disposition: A | Discharge status: Home / Self Care | Payer: 59 | Source: Ambulatory Visit | Attending: Obstetrics and Gynecology | Admitting: Obstetrics and Gynecology

## 2022-02-26 LAB — CBC
HCT: 35.8 % — ABNORMAL LOW (ref 36.0–46.0)
Hemoglobin: 12.1 g/dL (ref 12.0–15.0)
MCH: 29.2 pg (ref 26.0–34.0)
MCHC: 33.8 g/dL (ref 30.0–36.0)
MCV: 86.5 fL (ref 80.0–100.0)
Platelets: 330 10*3/uL (ref 150–400)
RBC: 4.14 MIL/uL (ref 3.87–5.11)
RDW: 12.2 % (ref 11.5–15.5)
WBC: 8.1 10*3/uL (ref 4.0–10.5)
nRBC: 0 % (ref 0.0–0.2)

## 2022-03-02 DIAGNOSIS — N939 Abnormal uterine and vaginal bleeding, unspecified: Secondary | ICD-10-CM | POA: Diagnosis not present

## 2022-03-02 DIAGNOSIS — N920 Excessive and frequent menstruation with regular cycle: Secondary | ICD-10-CM | POA: Diagnosis not present

## 2022-03-28 ENCOUNTER — Telehealth: Payer: Self-pay | Admitting: Emergency Medicine

## 2022-03-28 MED ORDER — SCOPOLAMINE 1 MG/3DAYS TD PT72: 1.0000 | MEDICATED_PATCH | TRANSDERMAL | 0 refills | Status: AC

## 2022-03-30 ENCOUNTER — Encounter: Payer: Self-pay | Admitting: Internal Medicine

## 2022-03-30 NOTE — Progress Notes (Signed)
Unable to contact patient to schedule echocardiogram, letter sent, order cancelled. 

## 2022-03-30 NOTE — Telephone Encounter (Signed)
This encounter was created in error - please disregard.

## 2022-04-07 DIAGNOSIS — Z1231 Encounter for screening mammogram for malignant neoplasm of breast: Secondary | ICD-10-CM | POA: Diagnosis not present

## 2022-05-12 ENCOUNTER — Other Ambulatory Visit (HOSPITAL_COMMUNITY): Payer: Self-pay

## 2022-05-12 DIAGNOSIS — L922 Granuloma faciale [eosinophilic granuloma of skin]: Secondary | ICD-10-CM | POA: Diagnosis not present

## 2022-05-12 DIAGNOSIS — L292 Pruritus vulvae: Secondary | ICD-10-CM | POA: Diagnosis not present

## 2022-05-12 DIAGNOSIS — N939 Abnormal uterine and vaginal bleeding, unspecified: Secondary | ICD-10-CM | POA: Diagnosis not present

## 2022-05-12 DIAGNOSIS — N920 Excessive and frequent menstruation with regular cycle: Secondary | ICD-10-CM | POA: Diagnosis not present

## 2022-05-12 MED ORDER — TRANEXAMIC ACID 650 MG PO TABS
1300.0000 mg | ORAL_TABLET | Freq: Three times a day (TID) | ORAL | 2 refills | Status: AC
  Filled 2022-05-12: qty 15, 3d supply, fill #0

## 2022-05-12 MED ORDER — TRIAMCINOLONE ACETONIDE 0.1 % EX OINT
1.0000 "application " | TOPICAL_OINTMENT | Freq: Two times a day (BID) | CUTANEOUS | 0 refills | Status: AC | PRN
  Filled 2022-05-12: qty 30, 15d supply, fill #0

## 2022-05-13 ENCOUNTER — Other Ambulatory Visit (HOSPITAL_COMMUNITY): Payer: Self-pay

## 2022-10-02 DIAGNOSIS — Z01419 Encounter for gynecological examination (general) (routine) without abnormal findings: Secondary | ICD-10-CM | POA: Diagnosis not present

## 2022-10-02 DIAGNOSIS — Z113 Encounter for screening for infections with a predominantly sexual mode of transmission: Secondary | ICD-10-CM | POA: Diagnosis not present

## 2022-10-02 DIAGNOSIS — N841 Polyp of cervix uteri: Secondary | ICD-10-CM | POA: Diagnosis not present

## 2022-10-02 DIAGNOSIS — Z1331 Encounter for screening for depression: Secondary | ICD-10-CM | POA: Diagnosis not present

## 2022-10-02 DIAGNOSIS — R61 Generalized hyperhidrosis: Secondary | ICD-10-CM | POA: Diagnosis not present

## 2022-10-02 DIAGNOSIS — N951 Menopausal and female climacteric states: Secondary | ICD-10-CM | POA: Diagnosis not present

## 2022-10-02 DIAGNOSIS — Z01411 Encounter for gynecological examination (general) (routine) with abnormal findings: Secondary | ICD-10-CM | POA: Diagnosis not present

## 2022-11-12 ENCOUNTER — Other Ambulatory Visit (HOSPITAL_COMMUNITY): Payer: Self-pay

## 2022-11-13 ENCOUNTER — Other Ambulatory Visit (HOSPITAL_COMMUNITY): Payer: Self-pay

## 2022-11-16 ENCOUNTER — Other Ambulatory Visit (HOSPITAL_COMMUNITY): Payer: Self-pay

## 2022-11-16 DIAGNOSIS — Z130 Encounter for screening for diseases of the blood and blood-forming organs and certain disorders involving the immune mechanism: Secondary | ICD-10-CM | POA: Diagnosis not present

## 2022-11-16 DIAGNOSIS — Z23 Encounter for immunization: Secondary | ICD-10-CM | POA: Diagnosis not present

## 2022-11-16 DIAGNOSIS — G43919 Migraine, unspecified, intractable, without status migrainosus: Secondary | ICD-10-CM | POA: Diagnosis not present

## 2022-11-16 DIAGNOSIS — Z13220 Encounter for screening for lipoid disorders: Secondary | ICD-10-CM | POA: Diagnosis not present

## 2022-11-16 DIAGNOSIS — K649 Unspecified hemorrhoids: Secondary | ICD-10-CM | POA: Diagnosis not present

## 2022-11-16 DIAGNOSIS — Z131 Encounter for screening for diabetes mellitus: Secondary | ICD-10-CM | POA: Diagnosis not present

## 2022-11-16 DIAGNOSIS — Z1329 Encounter for screening for other suspected endocrine disorder: Secondary | ICD-10-CM | POA: Diagnosis not present

## 2022-11-16 DIAGNOSIS — Z1321 Encounter for screening for nutritional disorder: Secondary | ICD-10-CM | POA: Diagnosis not present

## 2022-11-16 MED ORDER — HYDROCORTISONE ACETATE 25 MG RE SUPP
25.0000 mg | Freq: Two times a day (BID) | RECTAL | 0 refills | Status: AC
  Filled 2022-11-16: qty 10, 5d supply, fill #0

## 2022-11-16 MED ORDER — ONDANSETRON HCL 8 MG PO TABS
8.0000 mg | ORAL_TABLET | Freq: Three times a day (TID) | ORAL | 0 refills | Status: AC | PRN
  Filled 2022-11-16: qty 30, 10d supply, fill #0

## 2022-11-16 MED ORDER — SUMATRIPTAN SUCCINATE 50 MG PO TABS
50.0000 mg | ORAL_TABLET | ORAL | 0 refills | Status: AC
  Filled 2022-11-16: qty 12, 20d supply, fill #0

## 2022-11-16 MED ORDER — HYDROCORTISONE ACETATE 25 MG RE SUPP
25.0000 mg | Freq: Two times a day (BID) | RECTAL | 0 refills | Status: AC | PRN
  Filled 2022-11-16: qty 20, 10d supply, fill #0

## 2022-11-23 DIAGNOSIS — N939 Abnormal uterine and vaginal bleeding, unspecified: Secondary | ICD-10-CM | POA: Diagnosis not present

## 2022-11-23 DIAGNOSIS — N83201 Unspecified ovarian cyst, right side: Secondary | ICD-10-CM | POA: Diagnosis not present

## 2022-11-23 DIAGNOSIS — N83202 Unspecified ovarian cyst, left side: Secondary | ICD-10-CM | POA: Diagnosis not present

## 2022-11-25 ENCOUNTER — Other Ambulatory Visit: Payer: Self-pay | Admitting: General Surgery

## 2022-11-25 DIAGNOSIS — R1011 Right upper quadrant pain: Secondary | ICD-10-CM

## 2022-11-27 ENCOUNTER — Ambulatory Visit
Admission: RE | Admit: 2022-11-27 | Discharge: 2022-11-27 | Disposition: A | Discharge status: Home / Self Care | Payer: 59 | Source: Ambulatory Visit | Attending: General Surgery | Admitting: General Surgery

## 2022-11-27 DIAGNOSIS — R1011 Right upper quadrant pain: Secondary | ICD-10-CM

## 2022-11-27 MED ORDER — TECHNETIUM TC 99M MEBROFENIN IV KIT
5.3500 | PACK | Freq: Once | INTRAVENOUS | Status: AC | PRN
  Administered 2022-11-27: 5.35 via INTRAVENOUS

## 2023-01-29 ENCOUNTER — Telehealth: Payer: Commercial Managed Care - PPO | Admitting: Nurse Practitioner

## 2023-01-29 DIAGNOSIS — J0100 Acute maxillary sinusitis, unspecified: Secondary | ICD-10-CM

## 2023-01-30 MED ORDER — AMOXICILLIN-POT CLAVULANATE 875-125 MG PO TABS: 1.0000 | ORAL_TABLET | Freq: Two times a day (BID) | ORAL | 0 refills | Status: DC

## 2023-01-30 MED ORDER — FLUCONAZOLE 150 MG PO TABS: 150.0000 mg | ORAL_TABLET | Freq: Once | ORAL | 0 refills | Status: AC

## 2023-01-30 NOTE — Addendum Note (Signed)
Addended by: Chevis Pretty on: 01/30/2023 07:48 AM   Modules accepted: Orders

## 2023-01-30 NOTE — Progress Notes (Signed)

## 2023-04-25 ENCOUNTER — Telehealth: Payer: Commercial Managed Care - PPO | Admitting: Nurse Practitioner

## 2023-04-25 DIAGNOSIS — B9689 Other specified bacterial agents as the cause of diseases classified elsewhere: Secondary | ICD-10-CM | POA: Diagnosis not present

## 2023-04-25 DIAGNOSIS — J329 Chronic sinusitis, unspecified: Secondary | ICD-10-CM

## 2023-04-25 MED ORDER — AMOXICILLIN-POT CLAVULANATE 875-125 MG PO TABS: 1.0000 | ORAL_TABLET | Freq: Two times a day (BID) | ORAL | 0 refills | Status: DC

## 2023-04-25 MED ORDER — PROMETHAZINE-DM 6.25-15 MG/5ML PO SYRP: 5.0000 mL | ORAL_SOLUTION | Freq: Four times a day (QID) | ORAL | 0 refills | Status: DC | PRN

## 2023-04-25 NOTE — Progress Notes (Signed)
Virtual Visit Consent   Claudia Duran, you are scheduled for a virtual visit with a [facility] provider today. Just as with appointments in the office, your consent must be obtained to participate. Your consent will be active for this visit and any virtual visit you may have with one of our providers in the next 365 days. If you have a MyChart account, a copy of this consent can be sent to you electronically.  As this is a virtual visit, video technology does not allow for your provider to perform a traditional examination. This may limit your provider's ability to fully assess your condition. If your provider identifies any concerns that need to be evaluated in person or the need to arrange testing (such as labs, EKG, etc.), we will make arrangements to do so. Although advances in technology are sophisticated, we cannot ensure that it will always work on either your end or our end. If the connection with a video visit is poor, the visit may have to be switched to a telephone visit. With either a video or telephone visit, we are not always able to ensure that we have a secure connection.  By engaging in this virtual visit, you consent to the provision of healthcare and authorize for your insurance to be billed (if applicable) for the services provided during this visit. Depending on your insurance coverage, you may receive a charge related to this service.  I need to obtain your verbal consent now. Are you willing to proceed with your visit today? Claudia Duran has provided verbal consent on 04/25/2023 for a virtual visit (video or telephone). Claiborne Rigg, NP  Date: 04/25/2023 3:43 PM  Virtual Visit via Video Note   I, Claiborne Rigg, connected with  Claudia Duran  ([id number], October 10, 1971) on 04/25/23 at  4:00 PM EDT by a video-enabled telemedicine application and verified that I am speaking with the correct person using two identifiers.  Location: Patient: Virtual Visit Location  Patient: Home Provider: Virtual Visit Location Provider: Home Office   I discussed the limitations of evaluation and management by telemedicine and the availability of in person appointments. The patient expressed understanding and agreed to proceed.    History of Present Illness: Claudia Duran is a 52 y.o. who identifies as a female who was assigned female at birth, and is being seen today for bacterial sinusitis.  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 Has re occurring issues with sinusitis and endorses the following symptoms for greater than 1 week. Sinus pressure, sore throat, PND, fever 100.1, post nasal drip, headache, productive cough and purulent drainage.   Taking mucinex, tylenol, salt water gargles with no relief.   She does not smoke  Problems:  Patient Active Problem List   Diagnosis Date Noted   Chest pain of uncertain etiology 05/16/2021   Shortness of breath 05/16/2021   Palpitations 05/16/2021   Pure hypercholesterolemia 05/16/2021    Allergies:  Allergies  Allergen Reactions   Oseltamivir Phosphate Nausea And Vomiting   Aspirin    Medications:  Current Outpatient Medications:    promethazine-dextromethorphan (PROMETHAZINE-DM) 6.25-15 MG/5ML syrup, Take 5 mLs by mouth 4 (four) times daily as needed for cough., Disp: 240 mL, Rfl: 0   amoxicillin-clavulanate (AUGMENTIN) 875-125 MG tablet, Take 1 tablet by mouth 2 (two) times daily., Disp: 14 tablet, Rfl: 0   Biotin 10 MG TABS, Take 1 tablet by mouth daily., Disp: , Rfl:    chlorpheniramine-HYDROcodone (TUSSIONEX PENNKINETIC  ER) 10-8 MG/5ML SUER, Take 5 mLs by mouth every 12 (twelve) hours as needed for cough., Disp: 70 mL, Rfl: 0   hydrocortisone (ANUSOL-HC) 25 MG suppository, Unwrap and place 1 suppository (25 mg total) rectally every 12 (twelve) hours., Disp: 10 suppository, Rfl: 0   hydrocortisone (ANUSOL-HC) 25 MG suppository, Place 1 suppository (25 mg total) rectally 2 (two) times daily as needed for  Hemorrhoids, Disp: 20 suppository, Rfl: 0   hydrocortisone (ANUSOL-HC) 25 MG suppository, Unwrap and place 1 suppository (25 mg total) rectally every 12 (twelve) hours., Disp: 10 suppository, Rfl: 0   LORazepam (ATIVAN) 0.5 MG tablet, Take 1 tablet (0.5 mg total) by mouth every 8 (eight) hours as needed. For anxiety for up to 30 days. Max Daily Amount: 1.5 mg, Disp: 30 tablet, Rfl: 0   ondansetron (ZOFRAN) 8 MG tablet, Take 1 tablet (8 mg total) by mouth every 8 (eight) hours as needed for Nausea for up to 7 days, Disp: 30 tablet, Rfl: 0   predniSONE (DELTASONE) 50 MG tablet, Take one table daily for 6 days, Disp: 6 tablet, Rfl: 0   SUMAtriptan (IMITREX) 50 MG tablet, Take one tablet by mouth at the onset of headache, may repeat in 2 hours if needed. Max dose of 200mg  in 24 hours., Disp: 12 tablet, Rfl: 0   tranexamic acid (LYSTEDA) 650 MG TABS tablet, Take 2 tablets (1,300 mg total) by mouth 3 (three) times daily. Take for a maximum of 5 days during monthly menstruation, Disp: 15 tablet, Rfl: 2   traZODone (DESYREL) 50 MG tablet, Take 1 tablet by mouth at bedtime., Disp: 30 tablet, Rfl: 0   triamcinolone ointment (KENALOG) 0.1 %, Apply 1 application. topically 2 (two) times daily as needed for vulvar itching, Disp: 30 g, Rfl: 0  Observations/Objective: Patient is well-developed, well-nourished in no acute distress.  Resting comfortably at home.  Head is normocephalic, atraumatic.  No labored breathing.  Speech is clear and coherent with logical content.  Patient is alert and oriented at baseline.    Assessment and Plan: 1. Bacterial sinusitis - promethazine-dextromethorphan (PROMETHAZINE-DM) 6.25-15 MG/5ML syrup; Take 5 mLs by mouth 4 (four) times daily as needed for cough.  Dispense: 240 mL; Refill: 0 - amoxicillin-clavulanate (AUGMENTIN) 875-125 MG tablet; Take 1 tablet by mouth 2 (two) times daily.  Dispense: 14 tablet; Refill: 0  INSTRUCTIONS: use a humidifier for nasal congestion Drink  plenty of fluids, rest and wash hands frequently to avoid the spread of infection Alternate tylenol and Motrin for relief of fever   Follow Up Instructions: I discussed the assessment and treatment plan with the patient. The patient was provided an opportunity to ask questions and all were answered. The patient agreed with the plan and demonstrated an understanding of the instructions.  A copy of instructions were sent to the patient via MyChart unless otherwise noted below.    The patient was advised to call back or seek an in-person evaluation if the symptoms worsen or if the condition fails to improve as anticipated.  Time:  I spent 12 minutes with the patient via telehealth technology discussing the above problems/concerns.    Claiborne Rigg, NP

## 2023-04-25 NOTE — Patient Instructions (Signed)
Sherre Scarlet, thank you for joining Claiborne Rigg, NP for today's virtual visit.  While this provider is not your primary care provider (PCP), if your PCP is located in our provider database this encounter information will be shared with them immediately following your visit.   A [facility] MyChart account gives you access to today's visit and all your visits, tests, and labs performed at [facility] " click here if you don't have a [facility] MyChart account or go to [email or web address]  Consent: (Patient) Claudia Duran provided verbal consent for this virtual visit at the beginning of the encounter.  Current Medications:  Current Outpatient Medications:    promethazine-dextromethorphan (PROMETHAZINE-DM) 6.25-15 MG/5ML syrup, Take 5 mLs by mouth 4 (four) times daily as needed for cough., Disp: 240 mL, Rfl: 0   amoxicillin-clavulanate (AUGMENTIN) 875-125 MG tablet, Take 1 tablet by mouth 2 (two) times daily., Disp: 14 tablet, Rfl: 0   Biotin 10 MG TABS, Take 1 tablet by mouth daily., Disp: , Rfl:    chlorpheniramine-HYDROcodone (TUSSIONEX PENNKINETIC ER) 10-8 MG/5ML SUER, Take 5 mLs by mouth every 12 (twelve) hours as needed for cough., Disp: 70 mL, Rfl: 0   hydrocortisone (ANUSOL-HC) 25 MG suppository, Unwrap and place 1 suppository (25 mg total) rectally every 12 (twelve) hours., Disp: 10 suppository, Rfl: 0   hydrocortisone (ANUSOL-HC) 25 MG suppository, Place 1 suppository (25 mg total) rectally 2 (two) times daily as needed for Hemorrhoids, Disp: 20 suppository, Rfl: 0   hydrocortisone (ANUSOL-HC) 25 MG suppository, Unwrap and place 1 suppository (25 mg total) rectally every 12 (twelve) hours., Disp: 10 suppository, Rfl: 0   LORazepam (ATIVAN) 0.5 MG tablet, Take 1 tablet (0.5 mg total) by mouth every 8 (eight) hours as needed. For anxiety for up to 30 days. Max Daily Amount: 1.5 mg, Disp: 30 tablet, Rfl: 0   ondansetron (ZOFRAN) 8 MG tablet, Take 1 tablet (8 mg  total) by mouth every 8 (eight) hours as needed for Nausea for up to 7 days, Disp: 30 tablet, Rfl: 0   predniSONE (DELTASONE) 50 MG tablet, Take one table daily for 6 days, Disp: 6 tablet, Rfl: 0   SUMAtriptan (IMITREX) 50 MG tablet, Take one tablet by mouth at the onset of headache, may repeat in 2 hours if needed. Max dose of 200mg  in 24 hours., Disp: 12 tablet, Rfl: 0   tranexamic acid (LYSTEDA) 650 MG TABS tablet, Take 2 tablets (1,300 mg total) by mouth 3 (three) times daily. Take for a maximum of 5 days during monthly menstruation, Disp: 15 tablet, Rfl: 2   traZODone (DESYREL) 50 MG tablet, Take 1 tablet by mouth at bedtime., Disp: 30 tablet, Rfl: 0   triamcinolone ointment (KENALOG) 0.1 %, Apply 1 application. topically 2 (two) times daily as needed for vulvar itching, Disp: 30 g, Rfl: 0   Medications ordered in this encounter:  Meds ordered this encounter  Medications   promethazine-dextromethorphan (PROMETHAZINE-DM) 6.25-15 MG/5ML syrup    Sig: Take 5 mLs by mouth 4 (four) times daily as needed for cough.    Dispense:  240 mL    Refill:  0    Order Specific Question:   Supervising Provider    Answer:   Merrilee Jansky [id number]   amoxicillin-clavulanate (AUGMENTIN) 875-125 MG tablet    Sig: Take 1 tablet by mouth 2 (two) times daily.    Dispense:  14 tablet    Refill:  0    Order Specific Question:   Supervising  Provider    Answer:   Merrilee Jansky [[id number]]     *If you need refills on other medications prior to your next appointment, please contact your pharmacy*  Follow-Up: Call back or seek an in-person evaluation if the symptoms worsen or if the condition fails to improve as anticipated.  [facility] [phone]  Other Instructions INSTRUCTIONS: use a humidifier for nasal congestion Drink plenty of fluids, rest and wash hands frequently to avoid the spread of infection Alternate tylenol and Motrin for relief of fever    If you have been  instructed to have an in-person evaluation today at a local Urgent Care facility, please use the link below. It will take you to a list of all of our available [facility], including address, phone number and hours of operation. Please do not delay care.  [facility]  If you or a family member do not have a primary care provider, use the link below to schedule a visit and establish care. When you choose a [facility] primary care physician or advanced practice provider, you gain a long-term partner in health. Find a Primary Care Provider  Learn more about [facility]'s in-office and virtual care options: [facility] - Get Care Now

## 2023-07-27 ENCOUNTER — Other Ambulatory Visit (HOSPITAL_COMMUNITY): Payer: Self-pay

## 2023-07-27 MED ORDER — ETONOGESTREL-ETHINYL ESTRADIOL 0.12-0.015 MG/24HR VA RING
VAGINAL_RING | VAGINAL | 11 refills | Status: DC
  Filled 2023-07-27: qty 1, 28d supply, fill #0
  Filled 2023-09-09: qty 1, 28d supply, fill #1
  Filled 2023-10-19: qty 1, 28d supply, fill #2

## 2023-07-28 ENCOUNTER — Other Ambulatory Visit (HOSPITAL_COMMUNITY): Payer: Self-pay

## 2023-09-09 ENCOUNTER — Other Ambulatory Visit (HOSPITAL_COMMUNITY): Payer: Self-pay

## 2023-10-19 ENCOUNTER — Other Ambulatory Visit (HOSPITAL_COMMUNITY): Payer: Self-pay

## 2023-11-28 ENCOUNTER — Other Ambulatory Visit (HOSPITAL_COMMUNITY): Payer: Self-pay

## 2023-11-29 ENCOUNTER — Other Ambulatory Visit (HOSPITAL_COMMUNITY): Payer: Self-pay

## 2023-11-29 MED ORDER — ETONOGESTREL-ETHINYL ESTRADIOL 0.12-0.015 MG/24HR VA RING
1.0000 | VAGINAL_RING | VAGINAL | 2 refills | Status: AC
  Filled 2023-11-29: qty 1, 21d supply, fill #0
  Filled 2024-01-07: qty 1, 21d supply, fill #1

## 2024-01-07 ENCOUNTER — Other Ambulatory Visit: Payer: Self-pay

## 2024-01-07 ENCOUNTER — Other Ambulatory Visit (HOSPITAL_COMMUNITY): Payer: Self-pay

## 2024-01-07 DIAGNOSIS — Z01419 Encounter for gynecological examination (general) (routine) without abnormal findings: Secondary | ICD-10-CM | POA: Diagnosis not present

## 2024-01-07 DIAGNOSIS — Z01411 Encounter for gynecological examination (general) (routine) with abnormal findings: Secondary | ICD-10-CM | POA: Diagnosis not present

## 2024-01-07 DIAGNOSIS — K649 Unspecified hemorrhoids: Secondary | ICD-10-CM | POA: Diagnosis not present

## 2024-01-07 DIAGNOSIS — Z1331 Encounter for screening for depression: Secondary | ICD-10-CM | POA: Diagnosis not present

## 2024-01-07 DIAGNOSIS — Z124 Encounter for screening for malignant neoplasm of cervix: Secondary | ICD-10-CM | POA: Diagnosis not present

## 2024-01-07 MED ORDER — ETONOGESTREL-ETHINYL ESTRADIOL 0.12-0.015 MG/24HR VA RING
VAGINAL_RING | VAGINAL | 11 refills | Status: AC
  Filled 2024-01-07: qty 3, 84d supply, fill #0
  Filled 2024-02-17: qty 1, 28d supply, fill #0
  Filled 2024-03-23: qty 1, 28d supply, fill #1
  Filled 2024-05-18: qty 1, 28d supply, fill #2
  Filled 2024-06-21: qty 1, 28d supply, fill #3
  Filled 2024-07-25: qty 1, 28d supply, fill #4

## 2024-01-07 MED ORDER — HYDROCORTISONE ACETATE 25 MG RE SUPP
25.0000 mg | Freq: Two times a day (BID) | RECTAL | 1 refills | Status: AC
  Filled 2024-01-07: qty 20, 10d supply, fill #0

## 2024-01-10 ENCOUNTER — Other Ambulatory Visit: Payer: Self-pay

## 2024-02-17 ENCOUNTER — Other Ambulatory Visit: Payer: Self-pay

## 2024-03-23 ENCOUNTER — Other Ambulatory Visit (HOSPITAL_COMMUNITY): Payer: Self-pay

## 2024-03-23 ENCOUNTER — Other Ambulatory Visit: Payer: Self-pay

## 2024-03-23 DIAGNOSIS — G43111 Migraine with aura, intractable, with status migrainosus: Secondary | ICD-10-CM | POA: Diagnosis not present

## 2024-03-23 MED ORDER — UBRELVY 100 MG PO TABS
ORAL_TABLET | ORAL | 4 refills | Status: AC
  Filled 2024-03-23: qty 16, 16d supply, fill #0
  Filled 2024-04-11: qty 16, 30d supply, fill #0

## 2024-03-23 MED ORDER — PREDNISONE 10 MG PO TABS
ORAL_TABLET | ORAL | 0 refills | Status: AC
  Filled 2024-03-23: qty 21, 6d supply, fill #0

## 2024-03-23 MED ORDER — ONDANSETRON HCL 8 MG PO TABS
ORAL_TABLET | ORAL | 1 refills | Status: AC
  Filled 2024-03-23: qty 30, 10d supply, fill #0

## 2024-03-23 MED ORDER — ONDANSETRON HCL 8 MG PO TABS
8.0000 mg | ORAL_TABLET | Freq: Three times a day (TID) | ORAL | 1 refills | Status: AC | PRN
  Filled 2024-03-23: qty 30, 10d supply, fill #0
  Filled 2024-11-13: qty 30, 10d supply, fill #1

## 2024-03-23 MED ORDER — UBRELVY 100 MG PO TABS
100.0000 mg | ORAL_TABLET | ORAL | 4 refills | Status: DC
  Filled 2024-03-23: qty 8, 30d supply, fill #0
  Filled 2024-03-27: qty 16, 30d supply, fill #0
  Filled 2024-04-10: qty 16, 28d supply, fill #0

## 2024-03-27 ENCOUNTER — Other Ambulatory Visit: Payer: Self-pay

## 2024-03-27 ENCOUNTER — Other Ambulatory Visit: Payer: Self-pay | Admitting: Neurology

## 2024-03-27 DIAGNOSIS — G43111 Migraine with aura, intractable, with status migrainosus: Secondary | ICD-10-CM

## 2024-03-29 ENCOUNTER — Other Ambulatory Visit: Payer: Self-pay

## 2024-04-07 ENCOUNTER — Other Ambulatory Visit: Payer: Self-pay

## 2024-04-10 ENCOUNTER — Other Ambulatory Visit: Payer: Self-pay

## 2024-04-11 ENCOUNTER — Other Ambulatory Visit: Payer: Self-pay

## 2024-04-12 ENCOUNTER — Other Ambulatory Visit

## 2024-04-24 ENCOUNTER — Other Ambulatory Visit: Payer: Self-pay

## 2024-05-18 ENCOUNTER — Other Ambulatory Visit: Payer: Self-pay

## 2024-05-18 MED ORDER — PHENTERMINE HCL 37.5 MG PO TABS
37.5000 mg | ORAL_TABLET | Freq: Every day | ORAL | 0 refills | Status: DC
  Filled 2024-05-18: qty 30, 30d supply, fill #0

## 2024-05-24 ENCOUNTER — Encounter: Payer: Self-pay | Admitting: Emergency Medicine

## 2024-05-24 ENCOUNTER — Ambulatory Visit
Admission: EM | Admit: 2024-05-24 | Discharge: 2024-05-24 | Disposition: A | Discharge status: Home / Self Care | Attending: Family Medicine | Admitting: Family Medicine

## 2024-05-24 DIAGNOSIS — R051 Acute cough: Secondary | ICD-10-CM

## 2024-05-24 DIAGNOSIS — J0100 Acute maxillary sinusitis, unspecified: Secondary | ICD-10-CM

## 2024-05-24 DIAGNOSIS — R062 Wheezing: Secondary | ICD-10-CM | POA: Diagnosis not present

## 2024-05-24 DIAGNOSIS — N939 Abnormal uterine and vaginal bleeding, unspecified: Secondary | ICD-10-CM | POA: Insufficient documentation

## 2024-05-24 MED ORDER — AMOXICILLIN-POT CLAVULANATE 875-125 MG PO TABS: 1.0000 | ORAL_TABLET | Freq: Two times a day (BID) | ORAL | 0 refills | Status: DC

## 2024-05-24 MED ORDER — HYDROCOD POLI-CHLORPHE POLI ER 10-8 MG/5ML PO SUER: 5.0000 mL | Freq: Two times a day (BID) | ORAL | 0 refills | Status: AC | PRN

## 2024-05-24 MED ORDER — PREDNISONE 50 MG PO TABS
ORAL_TABLET | ORAL | 0 refills | Status: DC
Start: 2024-05-24 — End: 2024-11-04

## 2024-05-24 NOTE — ED Triage Notes (Signed)
 Pt presents c/o productive cough, chest congestion, and nasal congestion since Saturday. Pt denies emesis and diarrhea.

## 2024-05-24 NOTE — ED Provider Notes (Signed)
 [facility]   [id number] 05/24/24 Arrival Time: 0815  ASSESSMENT & PLAN:  1. Acute non-recurrent maxillary sinusitis   2. Acute cough   3. Wheezing    Begin: Meds ordered this encounter  Medications   predniSONE  (DELTASONE ) 50 MG tablet    Sig: Take one tablet by mouth for 5 days.    Dispense:  5 tablet    Refill:  0   amoxicillin -clavulanate (AUGMENTIN ) 875-125 MG tablet    Sig: Take 1 tablet by mouth every 12 (twelve) hours.    Dispense:  14 tablet    Refill:  0   chlorpheniramine-HYDROcodone (TUSSIONEX) 10-8 MG/5ML    Sig: Take 5 mLs by mouth every 12 (twelve) hours as needed for cough.    Dispense:  70 mL    Refill:  0   Work note provided. OTC symptom care as needed. Ensure adequate fluid intake and rest.   Follow-up Information     Emaline Handsome, MD.   Specialty: Family Medicine Why: As needed. Contact information: [address] [place] [address] [phone]         [facility]).   Specialty: Urgent Care Why: If worsening or failing to improve as anticipated. Contact information: [address] [place]  [id number] [phone]                 Discharge Instructions      Be aware, your cough medication may cause drowsiness. Please do not drive, operate heavy machinery or make important decisions while on this medication, it can cloud your judgement.    Reviewed expectations re: course of current medical issues. Questions answered. Outlined signs and symptoms indicating need for more acute intervention. Patient verbalized understanding. After Visit Summary given.   SUBJECTIVE: History from: patient.  Claudia Duran is a 53 y.o. female who presents with complaint of nasal congestion, post-nasal drainage, and sinus pain. Reports yearly sinusitis. Respiratory symptoms: with productive cough; now affecting sleep; feels  tightness/constriction of chest. Fever: denies. Overall normal PO intake without n/v.   Social History   Tobacco Use  Smoking Status Former   Current packs/day: 0.00   Average packs/day: 0.3 packs/day for 12.0 years (3.6 ttl pk-yrs)   Types: Cigarettes   Start date: 72   Quit date: 2010   Years since quitting: 15.4  Smokeless Tobacco Never     OBJECTIVE:  Vitals:   05/24/24 0844 05/24/24 0845  BP:  120/82  Pulse:  (!) 109  Resp:  18  Temp:  98 F (36.7 C)  TempSrc:  Oral  SpO2:  98%  Weight: 74.8 kg     General appearance: alert; no distress HEENT: nasal congestion; clear runny nose; throat irritation secondary to post-nasal drainage; bilateral maxillary tenderness to palpation; turbinates boggy Neck: supple without LAD; trachea midline Lungs: unlabored respirations, symmetrical air entry; cough: marked; no respiratory distress; with mild bilateral exp wheezing Skin: warm and dry Psychological: alert and cooperative; normal mood and affect  Allergies  Allergen Reactions   Oseltamivir  Phosphate Nausea And Vomiting   Aspirin     Past Medical History:  Diagnosis Date   Hyperlipidemia    Family History  Problem Relation Age of Onset   Healthy Mother    Heart disease Maternal Grandmother    Social History   Socioeconomic History   Marital status: Significant Other    Spouse name: Not on file   Number of children:  Not on file   Years of education: Not on file   Highest education level: Not on file  Occupational History   Not on file  Tobacco Use   Smoking status: Former    Current packs/day: 0.00    Average packs/day: 0.3 packs/day for 12.0 years (3.6 ttl pk-yrs)    Types: Cigarettes    Start date: 67    Quit date: 2010    Years since quitting: 15.4   Smokeless tobacco: Never  Vaping Use   Vaping status: Never Used  Substance and Sexual Activity   Alcohol use: Not Currently    Comment: once a month   Drug use: Never   Sexual activity: Yes   Other Topics Concern   Not on file  Social History Narrative   Not on file   Social Drivers of Health   Financial Resource Strain: Low Risk  (03/23/2024)   Received from [facility]   Overall Financial Resource Strain (CARDIA)    Difficulty of Paying Living Expenses: Not hard at all  Food Insecurity: No Food Insecurity (03/23/2024)   Received from [facility]   Hunger Vital Sign    Worried About Running Out of Food in the Last Year: Never true    Ran Out of Food in the Last Year: Never true  Transportation Needs: No Transportation Needs (03/23/2024)   Received from [facility] - Transportation    In the past 12 months, has lack of transportation kept you from medical appointments or from getting medications?: No    Lack of Transportation (Non-Medical): No  Physical Activity: Unknown (11/15/2022)   Received from [facility], [facility]   Exercise Vital Sign    Days of Exercise per Week: 4 days    Minutes of Exercise per Session: Not on file  Stress: Stress Concern Present (11/15/2022)   Received from [facility], [facility] - Occupational Stress Questionnaire    Feeling of Stress : To some extent  Social Connections: Socially Integrated (11/15/2022)   Received from [facility], [facility]   Social Network    How would you rate your social network (family, work, friends)?: Good participation with social networks  Intimate Partner Violence: Not At Risk (11/15/2022)   Received from [facility], [facility]   HITS    Over the last 12 months how often did your partner physically hurt you?: Never    Over the last 12 months how often did your partner insult you or talk down to you?: Never    Over the last 12 months how often did your partner threaten you with physical harm?: Never    Over the last 12 months how often did your partner scream or curse at you?: Never              Afton Albright, MD 05/24/24 [phone]

## 2024-05-24 NOTE — Discharge Instructions (Signed)
 Be aware, your cough medication may cause drowsiness. Please do not drive, operate heavy machinery or make important decisions while on this medication, it can cloud your judgement.

## 2024-06-21 ENCOUNTER — Other Ambulatory Visit: Payer: Self-pay

## 2024-06-22 ENCOUNTER — Other Ambulatory Visit: Payer: Self-pay

## 2024-06-22 MED ORDER — PHENTERMINE HCL 37.5 MG PO TABS
37.5000 mg | ORAL_TABLET | Freq: Every day | ORAL | 1 refills | Status: AC
  Filled 2024-06-22: qty 30, 30d supply, fill #0
  Filled 2024-07-22: qty 30, 30d supply, fill #1

## 2024-07-06 ENCOUNTER — Other Ambulatory Visit: Payer: Self-pay

## 2024-07-06 MED ORDER — PHENTERMINE HCL 37.5 MG PO TABS
37.5000 mg | ORAL_TABLET | Freq: Every morning | ORAL | 0 refills | Status: AC
  Filled 2024-07-06 – 2024-09-05 (×2): qty 30, 30d supply, fill #0

## 2024-07-24 ENCOUNTER — Other Ambulatory Visit: Payer: Self-pay

## 2024-07-25 ENCOUNTER — Other Ambulatory Visit: Payer: Self-pay

## 2024-09-03 ENCOUNTER — Other Ambulatory Visit: Payer: Self-pay

## 2024-09-03 MED ORDER — ETONOGESTREL-ETHINYL ESTRADIOL 0.12-0.015 MG/24HR VA RING
VAGINAL_RING | VAGINAL | 5 refills | Status: AC
  Filled 2024-09-03: qty 3, 84d supply, fill #0

## 2024-09-04 ENCOUNTER — Other Ambulatory Visit: Payer: Self-pay

## 2024-09-05 ENCOUNTER — Other Ambulatory Visit: Payer: Self-pay

## 2024-11-04 ENCOUNTER — Telehealth: Admitting: Family Medicine

## 2024-11-04 DIAGNOSIS — J019 Acute sinusitis, unspecified: Secondary | ICD-10-CM

## 2024-11-04 MED ORDER — PREDNISONE 10 MG (21) PO TBPK
ORAL_TABLET | ORAL | 0 refills | Status: AC
Start: 2024-11-04 — End: ?

## 2024-11-04 MED ORDER — AMOXICILLIN-POT CLAVULANATE 875-125 MG PO TABS: 1.0000 | ORAL_TABLET | Freq: Two times a day (BID) | ORAL | 0 refills | Status: AC

## 2024-11-04 NOTE — Patient Instructions (Signed)
 Claudia Duran, thank you for joining Roosvelt Mater, PA-C for today's virtual visit.  While this provider is not your primary care provider (PCP), if your PCP is located in our provider database this encounter information will be shared with them immediately following your visit.   A [facility] MyChart account gives you access to today's visit and all your visits, tests, and labs performed at [facility]  click here if you don't have a [facility] MyChart account or go to [email or web address]  Consent: (Patient) Claudia Duran provided verbal consent for this virtual visit at the beginning of the encounter.  Current Medications:  Current Outpatient Medications:    amoxicillin -clavulanate (AUGMENTIN ) 875-125 MG tablet, Take 1 tablet by mouth 2 (two) times daily for 7 days., Disp: 14 tablet, Rfl: 0   predniSONE  (STERAPRED UNI-PAK 21 TAB) 10 MG (21) TBPK tablet, Take following package directions., Disp: 21 tablet, Rfl: 0   Biotin 10 MG TABS, Take 1 tablet by mouth daily., Disp: , Rfl:    chlorpheniramine-HYDROcodone (TUSSIONEX) 10-8 MG/5ML, Take 5 mLs by mouth every 12 (twelve) hours as needed for cough., Disp: 70 mL, Rfl: 0   Cholecalciferol (D 1000) 25 MCG (1000 UT) capsule, Take 1,000 Units by mouth., Disp: , Rfl:    cyanocobalamin  (VITAMIN B12) 1000 MCG tablet, Take 2,000 mcg by mouth., Disp: , Rfl:    etonogestrel -ethinyl estradiol  (NUVARING) 0.12-0.015 MG/24HR vaginal ring, Place 1 ring vaginally every 21 ( twenty-one) days, then remove ring and place a new ring promptly., Disp: 1 each, Rfl: 2   etonogestrel -ethinyl estradiol  (NUVARING) 0.12-0.015 MG/24HR vaginal ring, Place 1 Ring vaginally monthly Leave in place for 3 consecutive weeks, then remove for 1 week., Disp: 1 each, Rfl: 11   etonogestrel -ethinyl estradiol  (NUVARING) 0.12-0.015 MG/24HR vaginal ring, Place 1 Ring vaginally monthly Leave in place for 3 consecutive weeks, then remove for 1 week., Disp: 1 each,  Rfl: 5   hydrocortisone  (ANUSOL -HC) 25 MG suppository, Unwrap and place 1 suppository (25 mg total) rectally every 12 (twelve) hours., Disp: 10 suppository, Rfl: 0   hydrocortisone  (ANUSOL -HC) 25 MG suppository, Place 1 suppository (25 mg total) rectally 2 (two) times daily as needed for Hemorrhoids, Disp: 20 suppository, Rfl: 0   hydrocortisone  (ANUSOL -HC) 25 MG suppository, Unwrap and place 1 suppository (25 mg total) rectally every 12 (twelve) hours., Disp: 10 suppository, Rfl: 0   hydrocortisone  (ANUSOL -HC) 25 MG suppository, Place 1 suppository (25 mg total) rectally 2 (two) times daily as needed for hemorrhoids., Disp: 20 suppository, Rfl: 1   ondansetron  (ZOFRAN ) 8 MG tablet, Take 1 tablet (8 mg total) by mouth every 8 (eight) hours as needed for Nausea for up to 7 days, Disp: 30 tablet, Rfl: 0   ondansetron  (ZOFRAN ) 8 MG tablet, Take 1 tablet (8 mg total) by mouth every 8 (eight) hours as needed for nausea (headache), Disp: 30 tablet, Rfl: 1   ondansetron  (ZOFRAN ) 8 MG tablet, Take 1 tablet (8 mg total) by mouth every 8 (eight) hours as needed for Nausea (headache)., Disp: 30 tablet, Rfl: 1   Phendimetrazine Tartrate 35 MG TABS, Take 1 tablet by mouth., Disp: , Rfl:    phentermine  (ADIPEX-P ) 37.5 MG tablet, Take 1 tablet (37.5 mg total) by mouth daily before breakfast., Disp: 30 tablet, Rfl: 1   phentermine  (ADIPEX-P ) 37.5 MG tablet, Take 1 tablet (37.5 mg total) by mouth every morning., Disp: 30 tablet, Rfl: 0   scopolamine  (TRANSDERM-SCOP) 1 MG/3DAYS, Apply one patch every 72 hours, Disp: , Rfl:  SUMAtriptan  (IMITREX ) 50 MG tablet, Take one tablet by mouth at the onset of headache, may repeat in 2 hours if needed. Max dose of 200mg  in 24 hours., Disp: 12 tablet, Rfl: 0   tranexamic acid  (LYSTEDA ) 650 MG TABS tablet, Take 2 tablets (1,300 mg total) by mouth 3 (three) times daily. Take for a maximum of 5 days during monthly menstruation, Disp: 15 tablet, Rfl: 2   triamcinolone  ointment  (KENALOG ) 0.1 %, Apply 1 application. topically 2 (two) times daily as needed for vulvar itching, Disp: 30 g, Rfl: 0   Ubrogepant  (UBRELVY ) 100 MG TABS, Take 100 mg by mouth as directed may repeat in 2 hours. No more than 200 mg per day., Disp: 16 tablet, Rfl: 4   Medications ordered in this encounter:  Meds ordered this encounter  Medications   amoxicillin -clavulanate (AUGMENTIN ) 875-125 MG tablet    Sig: Take 1 tablet by mouth 2 (two) times daily for 7 days.    Dispense:  14 tablet    Refill:  0   predniSONE  (STERAPRED UNI-PAK 21 TAB) 10 MG (21) TBPK tablet    Sig: Take following package directions.    Dispense:  21 tablet    Refill:  0    Please dispense one standard blister pack taper.     *If you need refills on other medications prior to your next appointment, please contact your pharmacy*  Follow-Up: Call back or seek an in-person evaluation if the symptoms worsen or if the condition fails to improve as anticipated.  [facility] [phone]  Other Instructions Sinus Infection, Adult A sinus infection, also called sinusitis, is inflammation of your sinuses. Sinuses are hollow spaces in the bones around your face. Your sinuses are located: Around your eyes. In the middle of your forehead. Behind your nose. In your cheekbones. Mucus normally drains out of your sinuses. When your nasal tissues become inflamed or swollen, mucus can become trapped or blocked. This allows bacteria, viruses, and fungi to grow, which leads to infection. Most infections of the sinuses are caused by a virus. A sinus infection can develop quickly. It can last for up to 4 weeks (acute) or for more than 12 weeks (chronic). A sinus infection often develops after a cold. What are the causes? This condition is caused by anything that creates swelling in the sinuses or stops mucus from draining. This includes: Allergies. Asthma. Infection from bacteria or viruses. Deformities or blockages  in your nose or sinuses. Abnormal growths in the nose (nasal polyps). Pollutants, such as chemicals or irritants in the air. Infection from fungi. This is rare. What increases the risk? You are more likely to develop this condition if you: Have a weak body defense system (immune system). Do a lot of swimming or diving. Overuse nasal sprays. Smoke. What are the signs or symptoms? The main symptoms of this condition are pain and a feeling of pressure around the affected sinuses. Other symptoms include: Stuffy nose or congestion that makes it difficult to breathe through your nose. Thick yellow or greenish drainage from your nose. Tenderness, swelling, and warmth over the affected sinuses. A cough that may get worse at night. Decreased sense of smell and taste. Extra mucus that collects in the throat or the back of the nose (postnasal drip) causing a sore throat or bad breath. Tiredness (fatigue). Fever. How is this diagnosed? This condition is diagnosed based on: Your symptoms. Your medical history. A physical exam. Tests to find out if your  condition is acute or chronic. This may include: Checking your nose for nasal polyps. Viewing your sinuses using a device that has a light (endoscope). Testing for allergies or bacteria. Imaging tests, such as an MRI or CT scan. In rare cases, a bone biopsy may be done to rule out more serious types of fungal sinus disease. How is this treated? Treatment for a sinus infection depends on the cause and whether your condition is chronic or acute. If caused by a virus, your symptoms should go away on their own within 10 days. You may be given medicines to relieve symptoms. They include: Medicines that shrink swollen nasal passages (decongestants). A spray that eases inflammation of the nostrils (topical intranasal corticosteroids). Rinses that help get rid of thick mucus in your nose (nasal saline washes). Medicines that treat allergies  (antihistamines). Over-the-counter pain relievers. If caused by bacteria, your health care provider may recommend waiting to see if your symptoms improve. Most bacterial infections will get better without antibiotic medicine. You may be given antibiotics if you have: A severe infection. A weak immune system. If caused by narrow nasal passages or nasal polyps, surgery may be needed. Follow these instructions at home: Medicines Take, use, or apply over-the-counter and prescription medicines only as told by your health care provider. These may include nasal sprays. If you were prescribed an antibiotic medicine, take it as told by your health care provider. Do not stop taking the antibiotic even if you start to feel better. Hydrate and humidify  Drink enough fluid to keep your urine pale yellow. Staying hydrated will help to thin your mucus. Use a cool mist humidifier to keep the humidity level in your home above 50%. Inhale steam for 10-15 minutes, 3-4 times a day, or as told by your health care provider. You can do this in the bathroom while a hot shower is running. Limit your exposure to cool or dry air. Rest Rest as much as possible. Sleep with your head raised (elevated). Make sure you get enough sleep each night. General instructions  Apply a warm, moist washcloth to your face 3-4 times a day or as told by your health care provider. This will help with discomfort. Use nasal saline washes as often as told by your health care provider. Wash your hands often with soap and water to reduce your exposure to germs. If soap and water are not available, use hand sanitizer. Do not smoke. Avoid being around people who are smoking (secondhand smoke). Keep all follow-up visits. This is important. Contact a health care provider if: You have a fever. Your symptoms get worse. Your symptoms do not improve within 10 days. Get help right away if: You have a severe headache. You have persistent  vomiting. You have severe pain or swelling around your face or eyes. You have vision problems. You develop confusion. Your neck is stiff. You have trouble breathing. These symptoms may be an emergency. Get help right away. Call 911. Do not wait to see if the symptoms will go away. Do not drive yourself to the hospital. Summary A sinus infection is soreness and inflammation of your sinuses. Sinuses are hollow spaces in the bones around your face. This condition is caused by nasal tissues that become inflamed or swollen. The swelling traps or blocks the flow of mucus. This allows bacteria, viruses, and fungi to grow, which leads to infection. If you were prescribed an antibiotic medicine, take it as told by your health care provider. Do not stop  taking the antibiotic even if you start to feel better. Keep all follow-up visits. This is important. This information is not intended to replace advice given to you by your health care provider. Make sure you discuss any questions you have with your health care provider. Document Revised: 11/11/2021 Document Reviewed: 11/11/2021 Elsevier Patient Education  2024 Elsevier Inc.   If you have been instructed to have an in-person evaluation today at a local Urgent Care facility, please use the link below. It will take you to a list of all of our available [facility], including address, phone number and hours of operation. Please do not delay care.  [facility]  If you or a family member do not have a primary care provider, use the link below to schedule a visit and establish care. When you choose a [facility] primary care physician or advanced practice provider, you gain a long-term partner in health. Find a Primary Care Provider  Learn more about 's in-office and virtual care options:  - Get Care Now

## 2024-11-04 NOTE — Progress Notes (Signed)
 Virtual Visit Consent   Claudia Duran, you are scheduled for a virtual visit with a [facility] provider today. Just as with appointments in the office, your consent must be obtained to participate. Your consent will be active for this visit and any virtual visit you may have with one of our providers in the next 365 days. If you have a MyChart account, a copy of this consent can be sent to you electronically.  As this is a virtual visit, video technology does not allow for your provider to perform a traditional examination. This may limit your provider's ability to fully assess your condition. If your provider identifies any concerns that need to be evaluated in person or the need to arrange testing (such as labs, EKG, etc.), we will make arrangements to do so. Although advances in technology are sophisticated, we cannot ensure that it will always work on either your end or our end. If the connection with a video visit is poor, the visit may have to be switched to a telephone visit. With either a video or telephone visit, we are not always able to ensure that we have a secure connection.  By engaging in this virtual visit, you consent to the provision of healthcare and authorize for your insurance to be billed (if applicable) for the services provided during this visit. Depending on your insurance coverage, you may receive a charge related to this service.  I need to obtain your verbal consent now. Are you willing to proceed with your visit today? Claudia Duran has provided verbal consent on 11/04/2024 for a virtual visit (video or telephone). Claudia Duran, [place]  Date: 11/04/2024 10:20 AM   Virtual Visit via Video Note   I, Claudia Duran, connected with  Claudia Duran  ([id number], 1971/03/10) on 11/04/24 at 10:15 AM EST by a video-enabled telemedicine application and verified that I am speaking with the correct person using two identifiers.  Location: Patient: Virtual Visit Location  Patient: Home Provider: Virtual Visit Location Provider: Home Office   I discussed the limitations of evaluation and management by telemedicine and the availability of in person appointments. The patient expressed understanding and agreed to proceed.    History of Present Illness: [name] is a 53 y.o. who identifies as a female who was assigned female at birth, and is being seen today for c/o once or twice a year she ends up with Covid and flu.  Pt states she has had symptoms since Sunday and has been taking Mucinex and Tessalon  pearls.  Pt states Tues-Wed she started to have pressure and pain in face, green mucus and feels like she is worsening.  Pt states she feels like she is headed to having a sinus infection or bronchitis.  Pt states cough is causing her rib cage to hurt. Pt states symptoms on going for about 6-7 days.   HPI: HPI  Problems:  Patient Active Problem List   Diagnosis Date Noted   Abnormal uterine bleeding (AUB) 05/24/2024   Intractable migraine without status migrainosus 11/16/2022   Chest pain of uncertain etiology 05/16/2021   Shortness of breath 05/16/2021   Palpitations 05/16/2021   Pure hypercholesterolemia 05/16/2021   Hemorrhoids 06/08/2016    Allergies:  Allergies  Allergen Reactions   Oseltamivir  Phosphate Nausea And Vomiting   Aspirin    Medications:  Current Outpatient Medications:    amoxicillin -clavulanate (AUGMENTIN ) 875-125 MG tablet, Take 1 tablet by mouth 2 (two) times daily for 7 days., Disp: 14  tablet, Rfl: 0   predniSONE  (STERAPRED UNI-PAK 21 TAB) 10 MG (21) TBPK tablet, Take following package directions., Disp: 21 tablet, Rfl: 0   Biotin 10 MG TABS, Take 1 tablet by mouth daily., Disp: , Rfl:    chlorpheniramine-HYDROcodone (TUSSIONEX) 10-8 MG/5ML, Take 5 mLs by mouth every 12 (twelve) hours as needed for cough., Disp: 70 mL, Rfl: 0   Cholecalciferol (D 1000) 25 MCG (1000 UT) capsule, Take 1,000 Units by mouth., Disp: , Rfl:     cyanocobalamin  (VITAMIN B12) 1000 MCG tablet, Take 2,000 mcg by mouth., Disp: , Rfl:    etonogestrel -ethinyl estradiol  (NUVARING) 0.12-0.015 MG/24HR vaginal ring, Place 1 ring vaginally every 21 ( twenty-one) days, then remove ring and place a new ring promptly., Disp: 1 each, Rfl: 2   etonogestrel -ethinyl estradiol  (NUVARING) 0.12-0.015 MG/24HR vaginal ring, Place 1 Ring vaginally monthly Leave in place for 3 consecutive weeks, then remove for 1 week., Disp: 1 each, Rfl: 11   etonogestrel -ethinyl estradiol  (NUVARING) 0.12-0.015 MG/24HR vaginal ring, Place 1 Ring vaginally monthly Leave in place for 3 consecutive weeks, then remove for 1 week., Disp: 1 each, Rfl: 5   hydrocortisone  (ANUSOL -HC) 25 MG suppository, Unwrap and place 1 suppository (25 mg total) rectally every 12 (twelve) hours., Disp: 10 suppository, Rfl: 0   hydrocortisone  (ANUSOL -HC) 25 MG suppository, Place 1 suppository (25 mg total) rectally 2 (two) times daily as needed for Hemorrhoids, Disp: 20 suppository, Rfl: 0   hydrocortisone  (ANUSOL -HC) 25 MG suppository, Unwrap and place 1 suppository (25 mg total) rectally every 12 (twelve) hours., Disp: 10 suppository, Rfl: 0   hydrocortisone  (ANUSOL -HC) 25 MG suppository, Place 1 suppository (25 mg total) rectally 2 (two) times daily as needed for hemorrhoids., Disp: 20 suppository, Rfl: 1   ondansetron  (ZOFRAN ) 8 MG tablet, Take 1 tablet (8 mg total) by mouth every 8 (eight) hours as needed for Nausea for up to 7 days, Disp: 30 tablet, Rfl: 0   ondansetron  (ZOFRAN ) 8 MG tablet, Take 1 tablet (8 mg total) by mouth every 8 (eight) hours as needed for nausea (headache), Disp: 30 tablet, Rfl: 1   ondansetron  (ZOFRAN ) 8 MG tablet, Take 1 tablet (8 mg total) by mouth every 8 (eight) hours as needed for Nausea (headache)., Disp: 30 tablet, Rfl: 1   Phendimetrazine Tartrate 35 MG TABS, Take 1 tablet by mouth., Disp: , Rfl:    phentermine  (ADIPEX-P ) 37.5 MG tablet, Take 1 tablet (37.5 mg total) by  mouth daily before breakfast., Disp: 30 tablet, Rfl: 1   phentermine  (ADIPEX-P ) 37.5 MG tablet, Take 1 tablet (37.5 mg total) by mouth every morning., Disp: 30 tablet, Rfl: 0   scopolamine  (TRANSDERM-SCOP) 1 MG/3DAYS, Apply one patch every 72 hours, Disp: , Rfl:    SUMAtriptan  (IMITREX ) 50 MG tablet, Take one tablet by mouth at the onset of headache, may repeat in 2 hours if needed. Max dose of 200mg  in 24 hours., Disp: 12 tablet, Rfl: 0   tranexamic acid  (LYSTEDA ) 650 MG TABS tablet, Take 2 tablets (1,300 mg total) by mouth 3 (three) times daily. Take for a maximum of 5 days during monthly menstruation, Disp: 15 tablet, Rfl: 2   triamcinolone  ointment (KENALOG ) 0.1 %, Apply 1 application. topically 2 (two) times daily as needed for vulvar itching, Disp: 30 g, Rfl: 0   Ubrogepant  (UBRELVY ) 100 MG TABS, Take 100 mg by mouth as directed may repeat in 2 hours. No more than 200 mg per day., Disp: 16 tablet, Rfl: 4  Observations/Objective: Patient is  well-developed, well-nourished in no acute distress.  Resting comfortably at home.  Head is normocephalic, atraumatic.  No labored breathing.  Speech is clear and coherent with logical content.  Patient is alert and oriented at baseline.    Assessment and Plan: 1. Acute non-recurrent sinusitis, unspecified location (Primary) - amoxicillin -clavulanate (AUGMENTIN ) 875-125 MG tablet; Take 1 tablet by mouth 2 (two) times daily for 7 days.  Dispense: 14 tablet; Refill: 0 - predniSONE  (STERAPRED UNI-PAK 21 TAB) 10 MG (21) TBPK tablet; Take following package directions.  Dispense: 21 tablet; Refill: 0  -Start Augmentin  and prednisone  to help with symptoms  -Pt advised to follow up in person with PCP or urgent care for worsening symptoms.  Follow Up Instructions: I discussed the assessment and treatment plan with the patient. The patient was provided an opportunity to ask questions and all were answered. The patient agreed with the plan and demonstrated an  understanding of the instructions.  A copy of instructions were sent to the patient via MyChart unless otherwise noted below.    The patient was advised to call back or seek an in-person evaluation if the symptoms worsen or if the condition fails to improve as anticipated.    Claudia Mater, PA-C
